# Patient Record
Sex: Female | Born: 1980
Health system: Southern US, Community
[De-identification: ages and names within clinical notes are randomized; demographics above are authoritative.]

## PROBLEM LIST (undated history)

## (undated) DIAGNOSIS — R51 Headache: Secondary | ICD-10-CM

## (undated) DIAGNOSIS — G709 Myoneural disorder, unspecified: Secondary | ICD-10-CM

## (undated) DIAGNOSIS — F419 Anxiety disorder, unspecified: Secondary | ICD-10-CM

## (undated) DIAGNOSIS — M542 Cervicalgia: Secondary | ICD-10-CM

## (undated) DIAGNOSIS — I471 Supraventricular tachycardia: Secondary | ICD-10-CM

## (undated) DIAGNOSIS — K219 Gastro-esophageal reflux disease without esophagitis: Secondary | ICD-10-CM

## (undated) DIAGNOSIS — D649 Anemia, unspecified: Secondary | ICD-10-CM

## (undated) DIAGNOSIS — K589 Irritable bowel syndrome without diarrhea: Secondary | ICD-10-CM

## (undated) DIAGNOSIS — M5430 Sciatica, unspecified side: Secondary | ICD-10-CM

## (undated) DIAGNOSIS — R519 Headache, unspecified: Secondary | ICD-10-CM

## (undated) HISTORY — DX: Supraventricular tachycardia: I47.1

---

## 2010-07-06 ENCOUNTER — Inpatient Hospital Stay: Payer: Self-pay | Admitting: Obstetrics and Gynecology

## 2012-09-08 ENCOUNTER — Ambulatory Visit: Payer: Self-pay | Admitting: Internal Medicine

## 2015-12-20 DIAGNOSIS — R102 Pelvic and perineal pain: Secondary | ICD-10-CM | POA: Diagnosis not present

## 2015-12-20 DIAGNOSIS — R141 Gas pain: Secondary | ICD-10-CM | POA: Diagnosis not present

## 2016-01-22 ENCOUNTER — Other Ambulatory Visit: Payer: Self-pay | Admitting: Student

## 2016-01-22 DIAGNOSIS — K219 Gastro-esophageal reflux disease without esophagitis: Secondary | ICD-10-CM | POA: Diagnosis not present

## 2016-01-22 DIAGNOSIS — R197 Diarrhea, unspecified: Secondary | ICD-10-CM | POA: Diagnosis not present

## 2016-01-22 DIAGNOSIS — R1084 Generalized abdominal pain: Secondary | ICD-10-CM | POA: Diagnosis not present

## 2016-01-26 ENCOUNTER — Ambulatory Visit
Admission: RE | Admit: 2016-01-26 | Discharge: 2016-01-26 | Disposition: A | Discharge status: Home / Self Care | Payer: 59 | Source: Ambulatory Visit | Attending: Student | Admitting: Student

## 2016-01-26 DIAGNOSIS — R1084 Generalized abdominal pain: Secondary | ICD-10-CM | POA: Insufficient documentation

## 2016-01-26 DIAGNOSIS — R197 Diarrhea, unspecified: Secondary | ICD-10-CM | POA: Insufficient documentation

## 2016-02-02 ENCOUNTER — Encounter: Payer: Self-pay | Admitting: *Deleted

## 2016-02-05 ENCOUNTER — Encounter: Payer: Self-pay | Admitting: *Deleted

## 2016-02-05 ENCOUNTER — Ambulatory Visit: Payer: 59 | Admitting: Anesthesiology

## 2016-02-05 ENCOUNTER — Ambulatory Visit
Admission: RE | Admit: 2016-02-05 | Discharge: 2016-02-05 | Disposition: A | Discharge status: Home / Self Care | Payer: 59 | Source: Ambulatory Visit | Attending: Gastroenterology | Admitting: Gastroenterology

## 2016-02-05 ENCOUNTER — Encounter
Admission: RE | Disposition: A | Discharge status: Home / Self Care | Payer: Self-pay | Source: Ambulatory Visit | Attending: Gastroenterology

## 2016-02-05 DIAGNOSIS — K529 Noninfective gastroenteritis and colitis, unspecified: Secondary | ICD-10-CM | POA: Diagnosis present

## 2016-02-05 DIAGNOSIS — D529 Folate deficiency anemia, unspecified: Secondary | ICD-10-CM | POA: Diagnosis not present

## 2016-02-05 DIAGNOSIS — R1084 Generalized abdominal pain: Secondary | ICD-10-CM | POA: Diagnosis not present

## 2016-02-05 DIAGNOSIS — Z79899 Other long term (current) drug therapy: Secondary | ICD-10-CM | POA: Diagnosis not present

## 2016-02-05 DIAGNOSIS — K228 Other specified diseases of esophagus: Secondary | ICD-10-CM | POA: Diagnosis not present

## 2016-02-05 DIAGNOSIS — K219 Gastro-esophageal reflux disease without esophagitis: Secondary | ICD-10-CM | POA: Insufficient documentation

## 2016-02-05 DIAGNOSIS — F419 Anxiety disorder, unspecified: Secondary | ICD-10-CM | POA: Insufficient documentation

## 2016-02-05 DIAGNOSIS — R197 Diarrhea, unspecified: Secondary | ICD-10-CM | POA: Diagnosis not present

## 2016-02-05 DIAGNOSIS — K3189 Other diseases of stomach and duodenum: Secondary | ICD-10-CM | POA: Diagnosis not present

## 2016-02-05 DIAGNOSIS — G709 Myoneural disorder, unspecified: Secondary | ICD-10-CM | POA: Diagnosis not present

## 2016-02-05 HISTORY — PX: COLONOSCOPY WITH PROPOFOL: SHX5780

## 2016-02-05 HISTORY — DX: Headache, unspecified: R51.9

## 2016-02-05 HISTORY — DX: Cervicalgia: M54.2

## 2016-02-05 HISTORY — DX: Myoneural disorder, unspecified: G70.9

## 2016-02-05 HISTORY — DX: Headache: R51

## 2016-02-05 HISTORY — DX: Gastro-esophageal reflux disease without esophagitis: K21.9

## 2016-02-05 HISTORY — PX: ESOPHAGOGASTRODUODENOSCOPY (EGD) WITH PROPOFOL: SHX5813

## 2016-02-05 HISTORY — DX: Sciatica, unspecified side: M54.30

## 2016-02-05 HISTORY — DX: Anemia, unspecified: D64.9

## 2016-02-05 HISTORY — DX: Anxiety disorder, unspecified: F41.9

## 2016-02-05 LAB — POCT PREGNANCY, URINE: PREG TEST UR: NEGATIVE

## 2016-02-05 SURGERY — COLONOSCOPY WITH PROPOFOL
Anesthesia: General

## 2016-02-05 MED ORDER — LACTATED RINGERS IV SOLN
INTRAVENOUS | Status: DC | PRN
  Administered 2016-02-05: 14:00:00 via INTRAVENOUS

## 2016-02-05 MED ORDER — SODIUM CHLORIDE 0.9 % IV SOLN
INTRAVENOUS | Status: DC
  Administered 2016-02-05: 14:00:00 via INTRAVENOUS

## 2016-02-05 MED ORDER — FENTANYL CITRATE (PF) 100 MCG/2ML IJ SOLN
INTRAMUSCULAR | Status: DC | PRN
  Administered 2016-02-05: 50 ug via INTRAVENOUS

## 2016-02-05 MED ORDER — PROPOFOL 500 MG/50ML IV EMUL
INTRAVENOUS | Status: DC | PRN
  Administered 2016-02-05: 75 ug/kg/min via INTRAVENOUS

## 2016-02-05 MED ORDER — MIDAZOLAM HCL 2 MG/2ML IJ SOLN
INTRAMUSCULAR | Status: DC | PRN
  Administered 2016-02-05: 1 mg via INTRAVENOUS

## 2016-02-05 NOTE — Anesthesia Preprocedure Evaluation (Signed)
Anesthesia Evaluation  Patient identified by MRN, date of birth, ID band Patient awake    Reviewed: Allergy & Precautions, H&P , NPO status , Patient's Chart, lab work & pertinent test results, reviewed documented beta blocker date and time   History of Anesthesia Complications Negative for: history of anesthetic complications  Airway Mallampati: I  TM Distance: >3 FB Neck ROM: full    Dental no notable dental hx. (+) Teeth Intact   Pulmonary neg pulmonary ROS,    Pulmonary exam normal breath sounds clear to auscultation       Cardiovascular Exercise Tolerance: Good negative cardio ROS Normal cardiovascular exam Rhythm:regular Rate:Normal     Neuro/Psych neg Seizures  Neuromuscular disease (Sciatica and cervicalgia) negative psych ROS   GI/Hepatic Neg liver ROS, GERD  Medicated,  Endo/Other  negative endocrine ROS  Renal/GU negative Renal ROS  negative genitourinary   Musculoskeletal   Abdominal   Peds  Hematology negative hematology ROS (+)   Anesthesia Other Findings Past Medical History:   Anemia                                                       Anxiety                                                      Neuromuscular disorder (HCC)                                 Cervicalgia                                                  GERD (gastroesophageal reflux disease)                       Headache                                                     Sciatica                                                     Reproductive/Obstetrics negative OB ROS                             Anesthesia Physical Anesthesia Plan  ASA: II  Anesthesia Plan: General   Post-op Pain Management:    Induction:   Airway Management Planned:   Additional Equipment:   Intra-op Plan:   Post-operative Plan:   Informed Consent: I have reviewed the patients History and Physical, chart, labs and  discussed the procedure including the risks, benefits and alternatives for the proposed anesthesia with  the patient or authorized representative who has indicated his/her understanding and acceptance.   Dental Advisory Given  Plan Discussed with: Anesthesiologist, CRNA and Surgeon  Anesthesia Plan Comments:         Anesthesia Quick Evaluation

## 2016-02-05 NOTE — Anesthesia Postprocedure Evaluation (Signed)
Anesthesia Post Note  Patient: Jill Andrews  Procedure(s) Performed: Procedure(s) (LRB): COLONOSCOPY WITH PROPOFOL (N/A) ESOPHAGOGASTRODUODENOSCOPY (EGD) WITH PROPOFOL (N/A)  Patient location during evaluation: Endoscopy Anesthesia Type: General Level of consciousness: awake and alert Pain management: pain level controlled Vital Signs Assessment: post-procedure vital signs reviewed and stable Respiratory status: spontaneous breathing, nonlabored ventilation, respiratory function stable and patient connected to nasal cannula oxygen Cardiovascular status: blood pressure returned to baseline and stable Postop Assessment: no signs of nausea or vomiting Anesthetic complications: no    Last Vitals:  Filed Vitals:   02/05/16 1500 02/05/16 1510  BP: 99/60 101/74  Pulse: 82 66  Temp:    Resp: 15 9    Last Pain: There were no vitals filed for this visit.               Lenard Simmer

## 2016-02-05 NOTE — Op Note (Signed)
[facility]enter[facility] Patient Name: Jill Andrews Procedure Date: 02/05/2016 2:06 PM MRN: [id number] Account #: [id number] Date of Birth: February 09, 1981 Admit Type: Outpatient Age: 35 Room: [facility] ENDO ROOM 2 Gender: Female Note Status: Finalized Procedure:            Upper GI endoscopy Indications:          Generalized abdominal pain, Heartburn, Suspected                        esophageal reflux Patient Profile:      This is a 35 year old female. Providers:            Rhona Raider. Shelle Iron, MD Referring MD:         Duane Lope. Judithann Sheen, MD (Referring MD) Medicines:            Propofol per Anesthesia Complications:        No immediate complications. Estimated blood loss:                        Minimal. Procedure:            Pre-Anesthesia Assessment:                       - Prior to the procedure, a History and Physical was                        performed, and patient medications, allergies and                        sensitivities were reviewed. The patient's tolerance of                        previous anesthesia was reviewed.                       After obtaining informed consent, the endoscope was                        passed under direct vision. Throughout the procedure,                        the patient's blood pressure, pulse, and oxygen                        saturations were monitored continuously. The Endoscope                        was introduced through the mouth, and advanced to the                        duodenal bulb. The upper GI endoscopy was accomplished                        without difficulty. The patient tolerated the procedure                        well. Findings:      The Z-line was irregular and was found at the gastroesophageal junction.       Biopsies were taken with a cold forceps for histology.      The stomach was normal.  The examined duodenum was normal. Impression:           - Z-line irregular, at the gastroesophageal junction.                         Biopsied.                       - Normal stomach.                       - Normal examined duodenum. Recommendation:       - Perform a colonoscopy today.                       - Continue present medications.                       - Await pathology results.                       - The findings and recommendations were discussed with                        the patient.                       - The findings and recommendations were discussed with                        the patient's family. Procedure Code(s):    --- Professional ---                       [phone], Esophagogastroduodenoscopy, flexible, transoral;                        with biopsy, single or multiple Diagnosis Code(s):    --- Professional ---                       K22.8, Other specified diseases of esophagus CPT copyright 2016 American Medical Association. All rights reserved. The codes documented in this report are preliminary and upon coder review may  be revised to meet current compliance requirements. Kathalene Frames, MD 02/05/2016 2:20:53 PM This report has been signed electronically. Number of Addenda: 0 Note Initiated On: 02/05/2016 2:06 PM      [facility]

## 2016-02-05 NOTE — Op Note (Addendum)
[facility]astroenterology Patient Name: Jill Andrews Procedure Date: 02/05/2016 2:04 PM MRN: [id number] Account #: [id number] Date of Birth: 13-Jul-1981 Admit Type: Outpatient Age: 35 Room: [facility] ENDO ROOM 2 Gender: Female Note Status: Finalized Procedure:            Colonoscopy Indications:          Chronic diarrhea Patient Profile:      This is a 35 year old female. Providers:            Rhona Raider. Shelle Iron, MD Referring MD:         Duane Lope. Judithann Sheen, MD (Referring MD) Medicines:            Propofol per Anesthesia Complications:        No immediate complications. Procedure:            Pre-Anesthesia Assessment:                       - Prior to the procedure, a History and Physical was                        performed, and patient medications, allergies and                        sensitivities were reviewed. The patient's tolerance of                        previous anesthesia was reviewed.                       After obtaining informed consent, the colonoscope was                        passed under direct vision. Throughout the procedure,                        the patient's blood pressure, pulse, and oxygen                        saturations were monitored continuously. The Olympus                        CF-Q160AL colonoscope (S#. [id number]) was introduced                        through the anus and advanced to the the terminal                        ileum. The colonoscopy was performed without                        difficulty. The patient tolerated the procedure well.                        The quality of the bowel preparation was good. Findings:      The perianal and digital rectal examinations were normal.      The terminal ileum appeared normal.      The entire examined colon appeared normal on direct and retroflexion       views.      Biopsies for histology were taken with a cold forceps from the right  colon, left colon and rectum for evaluation of  microscopic colitis. Impression:           - The examined portion of the ileum was normal.                       - The entire examined colon is normal on direct and                        retroflexion views.                       - Biopsies were taken with a cold forceps from the                        right colon, left colon and rectum for evaluation of                        microscopic colitis.                       - This is likely IBS-D Recommendation:       - Observe patient in GI recovery unit.                       - High fiber diet.                       - Continue present medications.                       - Await pathology results.                       - Return to GI clinic.                       - Try probiotic, low fodmap diet, as needed imodium                       - Consider xifaxin for IBS-D if above not helpful                       - The findings and recommendations were discussed with                        the patient.                       - The findings and recommendations were discussed with                        the patient's family. Procedure Code(s):    --- Professional ---                       [phone], Colonoscopy, flexible; with biopsy, single or                        multiple Diagnosis Code(s):    --- Professional ---                       K52.9, Noninfective gastroenteritis and colitis,  unspecified CPT copyright 2016 American Medical Association. All rights reserved. The codes documented in this report are preliminary and upon coder review may  be revised to meet current compliance requirements. Kathalene Frames, MD 02/05/2016 2:42:55 PM This report has been signed electronically. Number of Addenda: 0 Note Initiated On: 02/05/2016 2:04 PM Scope Withdrawal Time: 0 hours 11 minutes 30 seconds  Total Procedure Duration: 0 hours 15 minutes 57 seconds       [facility]

## 2016-02-05 NOTE — Transfer of Care (Signed)
Immediate Anesthesia Transfer of Care Note  Patient: Jill Andrews  Procedure(s) Performed: Procedure(s): COLONOSCOPY WITH PROPOFOL (N/A) ESOPHAGOGASTRODUODENOSCOPY (EGD) WITH PROPOFOL (N/A)  Patient Location: PACU  Anesthesia Type:General  Level of Consciousness: awake and alert   Airway & Oxygen Therapy: Patient Spontanous Breathing  Post-op Assessment: Report given to RN  Post vital signs: Reviewed and stable  Last Vitals:  Filed Vitals:   02/05/16 1329 02/05/16 1441  BP: 132/64 137/64  Pulse: 88 90  Temp: 36.3 C 36.4 C  Resp: 14 16    Complications: No apparent anesthesia complications

## 2016-02-05 NOTE — H&P (Signed)
  Primary Care Physician:  Marguarite Arbour, MD  Pre-Procedure History & Physical: HPI:  Jill Andrews is a 35 y.o. female is here for an endoscopyj / colonoscopy   Past Medical History  Diagnosis Date  . Anemia   . Anxiety   . Neuromuscular disorder (HCC)   . Cervicalgia   . GERD (gastroesophageal reflux disease)   . Headache   . Sciatica     History reviewed. No pertinent past surgical history.  Prior to Admission medications   Medication Sig Start Date End Date Taking? Authorizing Provider  metoprolol succinate (TOPROL-XL) 25 MG 24 hr tablet Take 25 mg by mouth daily.   Yes Historical Provider, MD  Multiple Vitamin (MULTI VITAMIN PO) Take 1 tablet by mouth daily.   Yes Historical Provider, MD  omeprazole (PRILOSEC) 40 MG capsule Take 40 mg by mouth daily.   Yes Historical Provider, MD  simethicone (MYLICON) 80 MG chewable tablet Chew 80 mg by mouth 4 (four) times daily as needed for flatulence.   Yes Historical Provider, MD  Zinc Acetate, Oral, (ZINC ACETATE PO) Take 1 capsule by mouth daily.   Yes Historical Provider, MD    Allergies as of 01/31/2016  . (Not on File)    History reviewed. No pertinent family history.  Social History   Social History  . Marital Status: Married    Spouse Name: N/A  . Number of Children: N/A  . Years of Education: N/A   Occupational History  . Not on file.   Social History Main Topics  . Smoking status: Never Smoker   . Smokeless tobacco: Never Used  . Alcohol Use: No  . Drug Use: No  . Sexual Activity: Not on file   Other Topics Concern  . Not on file   Social History Narrative     Physical Exam: BP 132/64 mmHg  Pulse 88  Temp(Src) 97.3 F (36.3 C) (Tympanic)  Resp 14  Ht 5' (1.524 m)  Wt 52.617 kg (116 lb)  BMI 22.65 kg/m2  SpO2 100%  LMP 01/22/2016 General:   Alert,  pleasant and cooperative in NAD Head:  Normocephalic and atraumatic. Neck:  Supple; no masses or thyromegaly. Lungs:  Clear throughout to  auscultation.    Heart:  Regular rate and rhythm. Abdomen:  Soft, nontender and nondistended. Normal bowel sounds, without guarding, and without rebound.   Neurologic:  Alert and  oriented x4;  grossly normal neurologically.  Impression/Plan: Jill Andrews is here for an endoscopy to be performed for GERD, abd pain, colon for diarrhea  Risks, benefits, limitations, and alternatives regarding  Endoscopy/colonoscopy have been reviewed with the patient.  Questions have been answered.  All parties agreeable.   Jill Maxwell, MD  02/05/2016, 2:05 PM

## 2016-02-05 NOTE — Discharge Instructions (Signed)

## 2016-02-06 ENCOUNTER — Encounter: Payer: Self-pay | Admitting: Gastroenterology

## 2016-02-08 LAB — SURGICAL PATHOLOGY

## 2016-02-22 DIAGNOSIS — Z3200 Encounter for pregnancy test, result unknown: Secondary | ICD-10-CM | POA: Diagnosis not present

## 2016-02-26 DIAGNOSIS — N926 Irregular menstruation, unspecified: Secondary | ICD-10-CM | POA: Diagnosis not present

## 2016-03-12 DIAGNOSIS — J069 Acute upper respiratory infection, unspecified: Secondary | ICD-10-CM | POA: Diagnosis not present

## 2016-03-31 ENCOUNTER — Ambulatory Visit
Admission: EM | Admit: 2016-03-31 | Discharge: 2016-03-31 | Disposition: A | Discharge status: Home / Self Care | Payer: 59 | Attending: Family Medicine | Admitting: Family Medicine

## 2016-03-31 ENCOUNTER — Encounter: Payer: Self-pay | Admitting: *Deleted

## 2016-03-31 ENCOUNTER — Emergency Department
Admission: EM | Admit: 2016-03-31 | Discharge: 2016-03-31 | Disposition: A | Discharge status: Home / Self Care | Payer: 59 | Attending: Family Medicine | Admitting: Family Medicine

## 2016-03-31 DIAGNOSIS — J020 Streptococcal pharyngitis: Secondary | ICD-10-CM

## 2016-03-31 DIAGNOSIS — J029 Acute pharyngitis, unspecified: Secondary | ICD-10-CM | POA: Insufficient documentation

## 2016-03-31 DIAGNOSIS — Z79899 Other long term (current) drug therapy: Secondary | ICD-10-CM | POA: Insufficient documentation

## 2016-03-31 DIAGNOSIS — D649 Anemia, unspecified: Secondary | ICD-10-CM | POA: Insufficient documentation

## 2016-03-31 DIAGNOSIS — Z5321 Procedure and treatment not carried out due to patient leaving prior to being seen by health care provider: Secondary | ICD-10-CM | POA: Insufficient documentation

## 2016-03-31 HISTORY — DX: Irritable bowel syndrome without diarrhea: K58.9

## 2016-03-31 LAB — RAPID STREP SCREEN (MED CTR MEBANE ONLY): STREPTOCOCCUS, GROUP A SCREEN (DIRECT): POSITIVE — AB

## 2016-03-31 MED ORDER — AZITHROMYCIN 250 MG PO TABS: ORAL_TABLET | ORAL | Status: DC

## 2016-03-31 NOTE — ED Notes (Signed)
Pt reports started on Friday with sore throat feeling on her left side and only when she swallowed. Pain worse and pt states she feels like she can not swallow well. Pt clearing throat frequently in triage but is handling her secretions with no difficulty.

## 2016-03-31 NOTE — ED Notes (Signed)
Pt states that she has a sore throat, started on Friday, sore on the left side shooting pain into left ear.  Pt states that she feels like it is "swelling shut", was [facility] at [facility] ED at 1:30 am, was told that her throat was not swollen shut, so she left.

## 2016-03-31 NOTE — Discharge Instructions (Signed)

## 2016-03-31 NOTE — ED Provider Notes (Signed)
CSN: [id number]     Arrival date & time 03/31/16  0807 History   None    Chief Complaint  Patient presents with  . Sore Throat   (Consider location/radiation/quality/duration/timing/severity/associated sxs/prior Treatment) HPI   35 year old female who started having a sore throat 2 days ago which is more on the left side with shooting pain into her left ear. He states that her throat feels like it is "swelling shut" she became alarmed last night and went to [facility] emergency department at 1:30 AM was told that her throat was not swollen shut and she had 22 other people waiting in front of her so she left and decided to come here this morning. He has been afebrile. 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 In review of the patient's records she was seen 1 month ago with a URI. She does not have significant cough.  Past Medical History  Diagnosis Date  . Anemia   . Anxiety   . Neuromuscular disorder (HCC)   . Cervicalgia   . GERD (gastroesophageal reflux disease)   . Headache   . Sciatica   . Irritable bowel syndrome (IBS)    Past Surgical History  Procedure Laterality Date  . Colonoscopy with propofol N/A 02/05/2016    Procedure: COLONOSCOPY WITH PROPOFOL;  Surgeon: Elnita MaxwellMatthew Gordon Rein, MD;  [facility];  Service: Endoscopy;  Laterality: N/A;  . Esophagogastroduodenoscopy (egd) with propofol N/A 02/05/2016    Procedure: ESOPHAGOGASTRODUODENOSCOPY (EGD) WITH PROPOFOL;  Surgeon: Elnita MaxwellMatthew Gordon Rein, MD;  Location[facility] ENDOSCOPY;  Service: Endoscopy;  Laterality: N/A;   No family history on file. Social History  Substance Use Topics  . Smoking status: Never Smoker   . Smokeless tobacco: Never Used  . Alcohol Use: No   OB History    No data available     Review of Systems  Constitutional: Negative for fever, chills, activity change and fatigue.  HENT: Positive for sore throat.   Respiratory: Negative for cough, shortness  of breath, wheezing and stridor.   All other systems reviewed and are negative.   Allergies  Penicillins and Sulfa antibiotics  Home Medications   Prior to Admission medications   Medication Sig Start Date End Date Taking? Authorizing Provider  azithromycin (ZITHROMAX Z-PAK) 250 MG tablet Use as per package instructions 03/31/16   Lutricia FeilWilliam P Kerina Simoneau, PA-C  metoprolol succinate (TOPROL-XL) 25 MG 24 hr tablet Take 25 mg by mouth daily.    Historical Provider, MD  Multiple Vitamin (MULTI VITAMIN PO) Take 1 tablet by mouth daily.    Historical Provider, MD  omeprazole (PRILOSEC) 40 MG capsule Take 40 mg by mouth daily.    Historical Provider, MD  simethicone (MYLICON) 80 MG chewable tablet Chew 80 mg by mouth 4 (four) times daily as needed for flatulence.    Historical Provider, MD  Zinc Acetate, Oral, (ZINC ACETATE PO) Take 1 capsule by mouth daily.    Historical Provider, MD   Meds Ordered and Administered this Visit  Medications - No data to display  BP 115/69 mmHg  Pulse 96  Temp(Src) 98.2 F (36.8 C) (Oral)  Ht 5' (1.524 m)  Wt 118 lb (53.524 kg)  BMI 23.05 kg/m2  SpO2 100%  LMP 03/28/2016 No data found.   Physical Exam  Constitutional: She is oriented to person, place, and time. She appears well-developed and well-nourished. No distress.  HENT:  Head: Normocephalic and atraumatic.  Right Ear: External  ear normal.  Left Ear: External ear normal.  Nose: Nose normal.  Mouth/Throat: Oropharynx is clear and moist. No oropharyngeal exudate.  Eyes: Conjunctivae are normal. Pupils are equal, round, and reactive to light. Right eye exhibits no discharge. Left eye exhibits no discharge.  Neck: Normal range of motion. Neck supple.  Pulmonary/Chest: Effort normal and breath sounds normal. No respiratory distress. She has no wheezes.  Musculoskeletal: Normal range of motion. She exhibits no edema or tenderness.  Lymphadenopathy:    She has no cervical adenopathy.  Neurological: She  is alert and oriented to person, place, and time.  Skin: Skin is warm and dry. She is not diaphoretic.  Psychiatric: She has a normal mood and affect. Her behavior is normal. Judgment and thought content normal.  Nursing note and vitals reviewed.   ED Course  Procedures (including critical care time)  Labs Review Labs Reviewed  RAPID STREP SCREEN (NOT AT [facility]) - Abnormal; Notable for the following:    Streptococcus, Group A Screen (Direct) POSITIVE (*)    All other components within normal limits    Imaging Review No results found.   Visual Acuity Review  Right Eye Distance:   Left Eye Distance:   Bilateral Distance:    Right Eye Near:   Left Eye Near:    Bilateral Near:         MDM   1. Strep pharyngitis    Discharge Medication List as of 03/31/2016  9:19 AM    START taking these medications   Details  azithromycin (ZITHROMAX Z-PAK) 250 MG tablet Use as per package instructions, Normal      Plan: 1. Test/x-ray results and diagnosis reviewed with patient 2. rx as per orders; risks, benefits, potential side effects reviewed with patient 3. Recommend supportive treatment with  Motrin as necessary for sore throat. And also gargle with salt water ad lib. Recommended the use of lozenges or cough drops to help soothe her throat during the daytime. At nighttime she should consider using a cool mist vaporizer in her house. We'll keep her out of work tomorrow until she's had 24 hours of antibiotic use and then may return to work on Tuesday. If she is not improving she should follow-up with her primary care physician. 4. F/u prn if symptoms worsen or don't improve     Lutricia Feil, PA-C 03/31/16 [phone]

## 2016-04-01 DIAGNOSIS — J020 Streptococcal pharyngitis: Secondary | ICD-10-CM | POA: Diagnosis not present

## 2016-07-01 DIAGNOSIS — R000 Tachycardia, unspecified: Secondary | ICD-10-CM | POA: Diagnosis not present

## 2016-07-01 DIAGNOSIS — Z79899 Other long term (current) drug therapy: Secondary | ICD-10-CM | POA: Diagnosis not present

## 2016-07-01 DIAGNOSIS — R002 Palpitations: Secondary | ICD-10-CM | POA: Diagnosis not present

## 2016-07-03 DIAGNOSIS — I471 Supraventricular tachycardia: Secondary | ICD-10-CM | POA: Diagnosis not present

## 2016-07-03 DIAGNOSIS — R002 Palpitations: Secondary | ICD-10-CM | POA: Diagnosis not present

## 2016-07-08 DIAGNOSIS — N926 Irregular menstruation, unspecified: Secondary | ICD-10-CM | POA: Diagnosis not present

## 2016-07-09 DIAGNOSIS — R002 Palpitations: Secondary | ICD-10-CM | POA: Diagnosis not present

## 2016-07-12 DIAGNOSIS — I471 Supraventricular tachycardia: Secondary | ICD-10-CM | POA: Diagnosis not present

## 2016-07-12 DIAGNOSIS — R002 Palpitations: Secondary | ICD-10-CM | POA: Diagnosis not present

## 2016-07-16 DIAGNOSIS — I471 Supraventricular tachycardia: Secondary | ICD-10-CM | POA: Diagnosis not present

## 2016-07-16 DIAGNOSIS — R002 Palpitations: Secondary | ICD-10-CM | POA: Diagnosis not present

## 2016-07-29 DIAGNOSIS — O200 Threatened abortion: Secondary | ICD-10-CM | POA: Diagnosis not present

## 2016-08-05 DIAGNOSIS — Z3480 Encounter for supervision of other normal pregnancy, unspecified trimester: Secondary | ICD-10-CM | POA: Diagnosis not present

## 2016-08-05 LAB — OB RESULTS CONSOLE VARICELLA ZOSTER ANTIBODY, IGG: Varicella: IMMUNE

## 2016-08-05 LAB — OB RESULTS CONSOLE HEPATITIS B SURFACE ANTIGEN: Hepatitis B Surface Ag: NEGATIVE

## 2016-08-05 LAB — OB RESULTS CONSOLE ABO/RH: RH TYPE: POSITIVE

## 2016-08-05 LAB — OB RESULTS CONSOLE HIV ANTIBODY (ROUTINE TESTING): HIV: NONREACTIVE

## 2016-08-05 LAB — OB RESULTS CONSOLE RUBELLA ANTIBODY, IGM: Rubella: IMMUNE

## 2016-08-08 ENCOUNTER — Other Ambulatory Visit: Payer: Self-pay | Admitting: Obstetrics and Gynecology

## 2016-08-08 DIAGNOSIS — Z369 Encounter for antenatal screening, unspecified: Secondary | ICD-10-CM

## 2016-08-09 IMAGING — US US ABDOMEN COMPLETE
1 series · 14 of 25 positions shown · non-contrast
Comparison: None.

CLINICAL DATA: Generalized abdominal pain for 6 weeks.  Diarrhea.

EXAM:
ABDOMEN ULTRASOUND COMPLETE

[Series 1: us abdomen complete · 0.17mm/px · 14 of 96 slices shown]
[im 1/96]
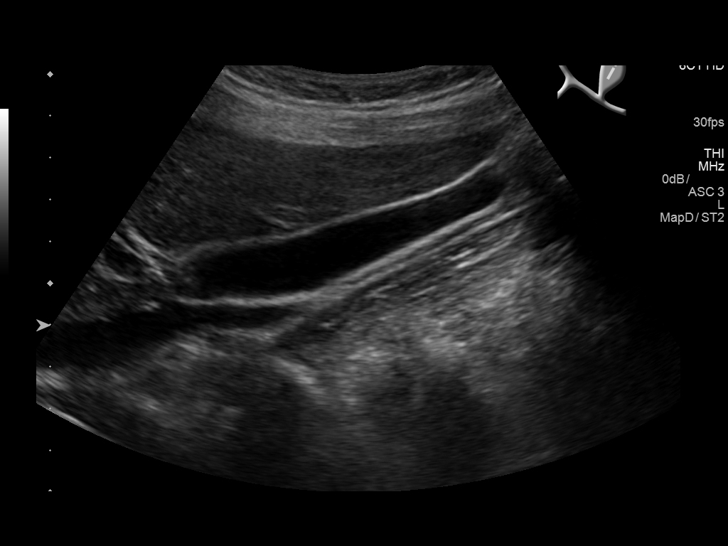
[im 8/96]
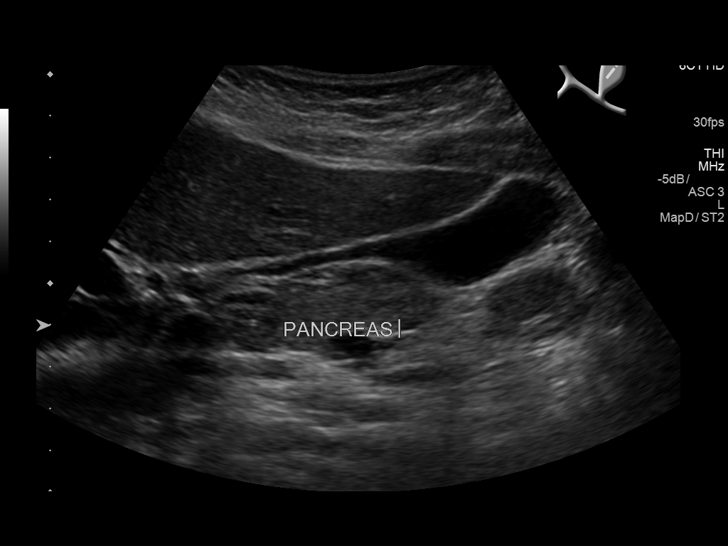
[im 16/96]
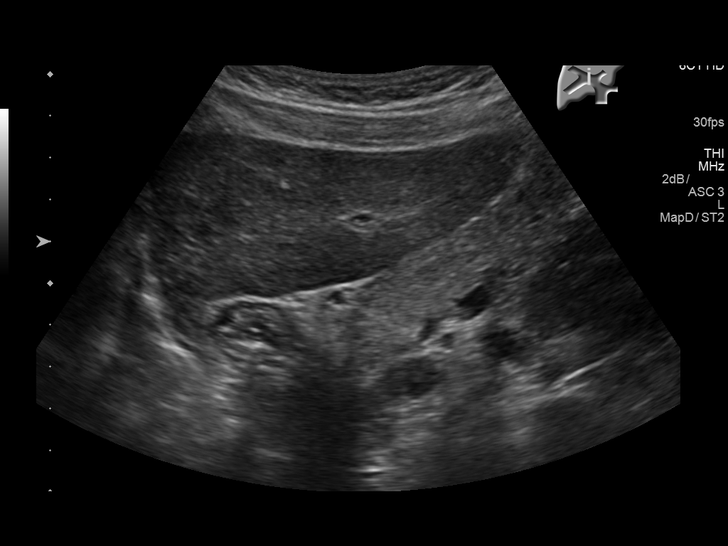
[im 24/96]
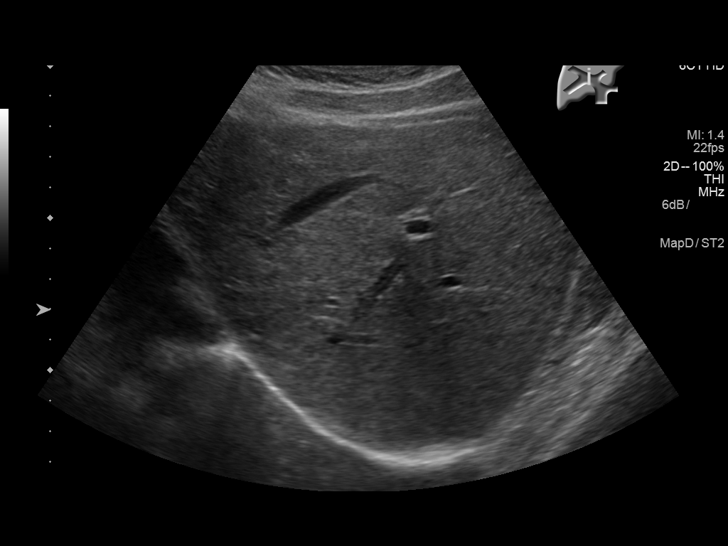
[im 32/96]
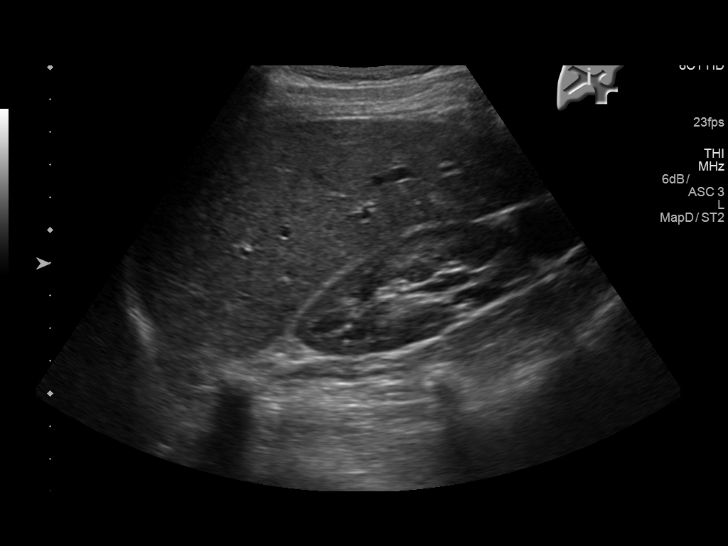
[im 36/96]
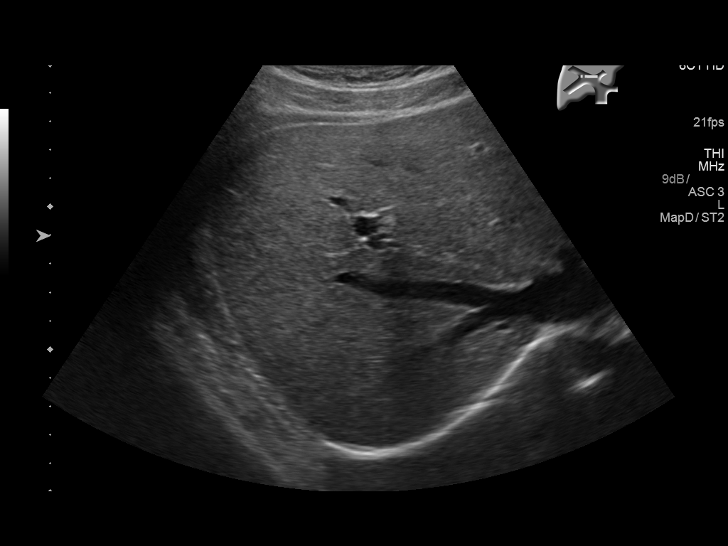
[im 44/96]
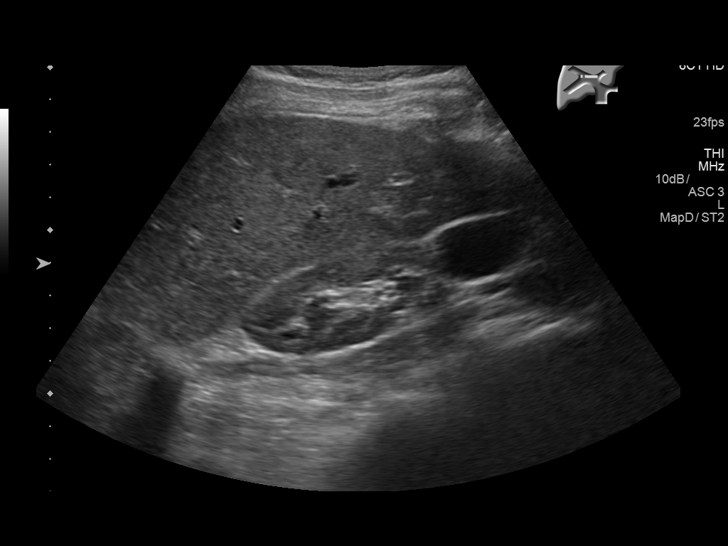
[im 52/96]
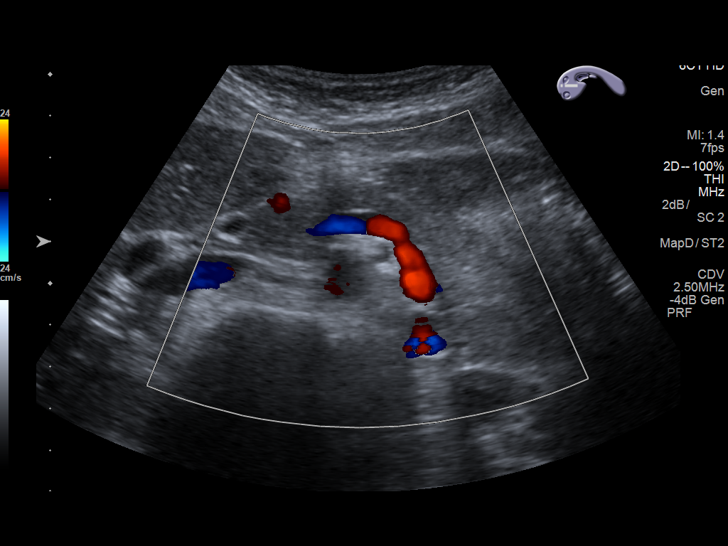
[im 60/96]
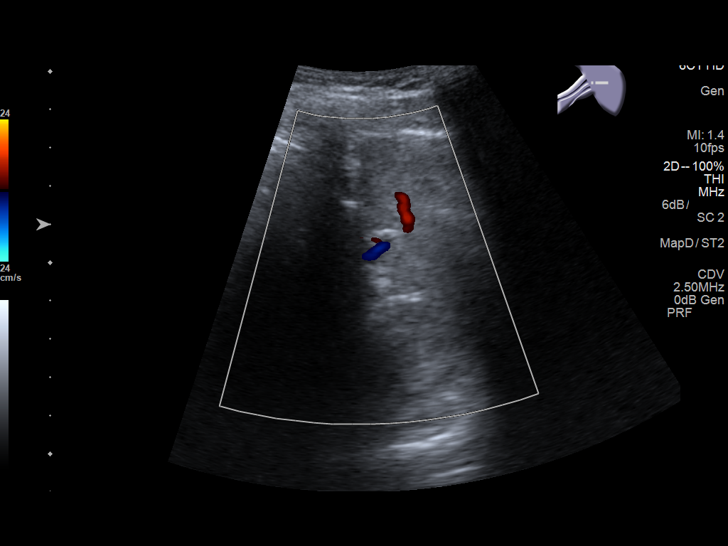
[im 64/96]
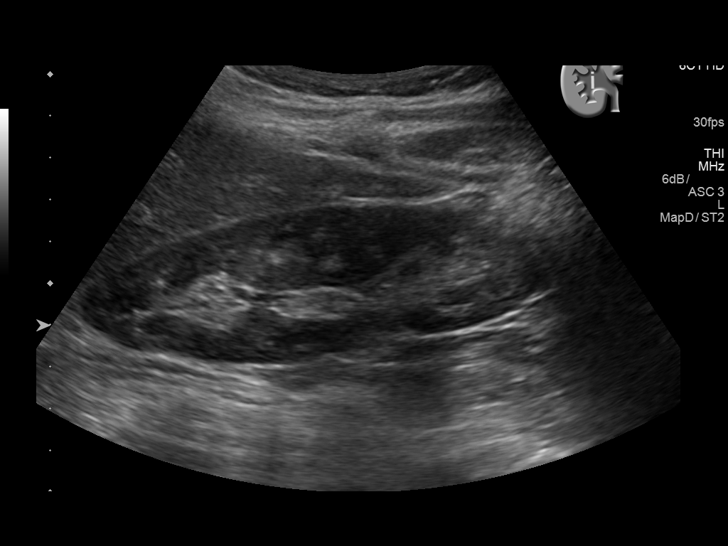
[im 72/96]
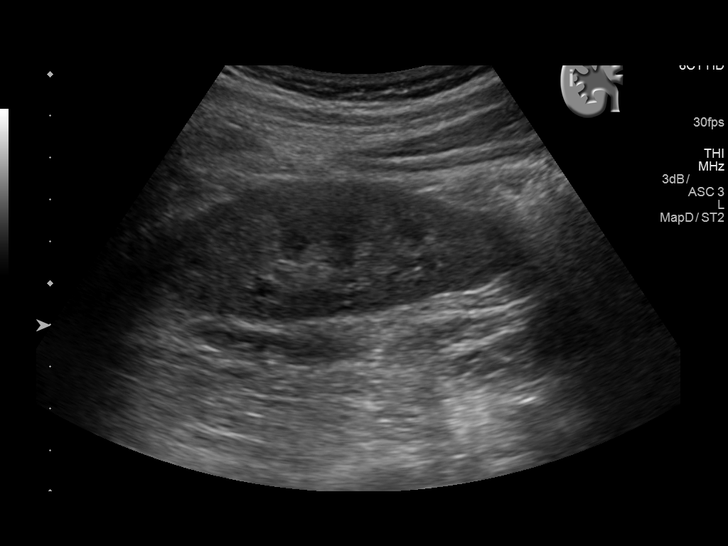
[im 80/96]
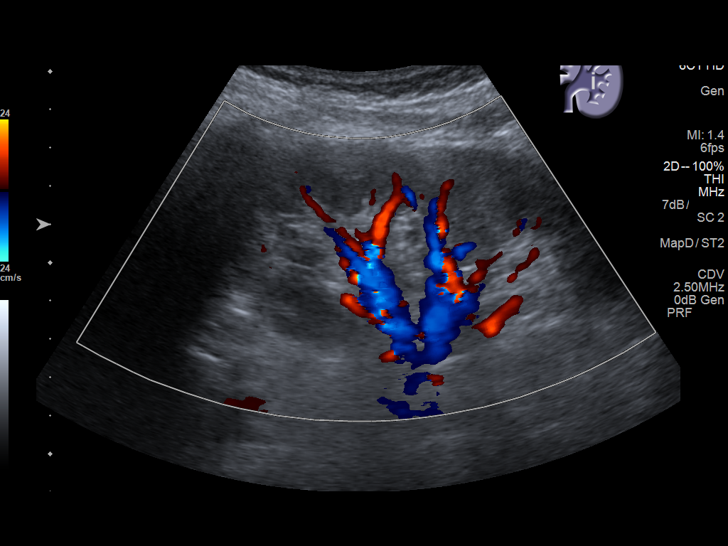
[im 88/96]
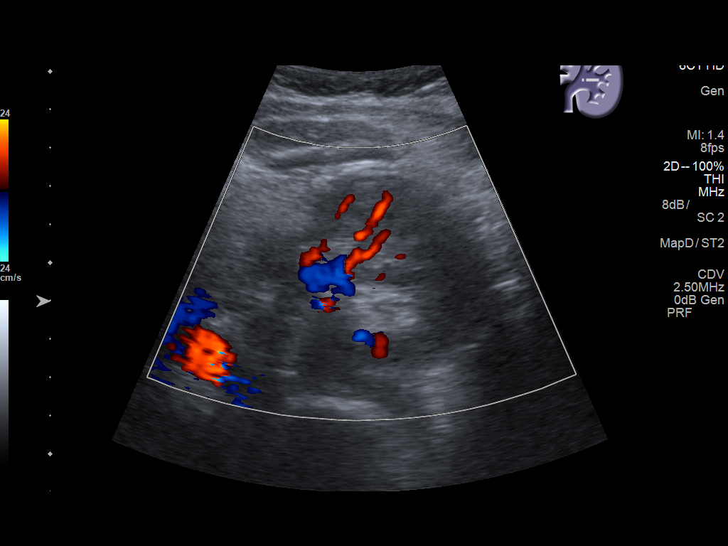
[im 96/96]
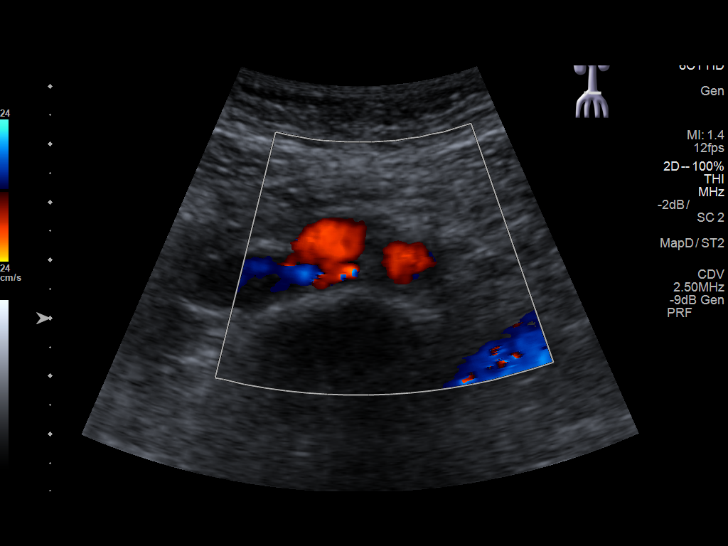

[14 of 25 positions shown; findings below may reference images not displayed]

FINDINGS: Gallbladder: No gallstones or wall thickening visualized. No
sonographic Murphy sign noted by sonographer.

Common bile duct: Diameter: 5 mm, within normal limits.

Liver: No focal lesion identified. Within normal limits in
parenchymal echogenicity.

IVC: No abnormality visualized.

Pancreas: Visualized portion unremarkable.

Spleen: Size and appearance within normal limits.

Right Kidney: Length: 11.3 cm. Echogenicity within normal limits. No
mass or hydronephrosis visualized.

Left Kidney: Length: 11.4 cm. Echogenicity within normal limits. No
mass or hydronephrosis visualized.

Abdominal aorta: No aneurysm visualized.

Other findings: None.
IMPRESSION: Negative abdominal ultrasound.

## 2016-08-29 ENCOUNTER — Other Ambulatory Visit: Payer: 59

## 2016-08-29 ENCOUNTER — Ambulatory Visit: Payer: 59

## 2016-09-06 DIAGNOSIS — Z23 Encounter for immunization: Secondary | ICD-10-CM | POA: Diagnosis not present

## 2016-12-12 DIAGNOSIS — Z3480 Encounter for supervision of other normal pregnancy, unspecified trimester: Secondary | ICD-10-CM | POA: Diagnosis not present

## 2016-12-13 DIAGNOSIS — O4442 Low lying placenta NOS or without hemorrhage, second trimester: Secondary | ICD-10-CM | POA: Diagnosis not present

## 2016-12-13 DIAGNOSIS — Z23 Encounter for immunization: Secondary | ICD-10-CM | POA: Diagnosis not present

## 2016-12-16 NOTE — L&D Delivery Note (Signed)
Delivery Note At 6:40 AM on 03/04/17 a viable female was delivered via Vaginal, Spontaneous Delivery (Presentation: ROA ).  APGAR: ,8/9 weight  .  Delayed cord clamping . Placenta status: intact .  Cord:nuchal reduced   with the following complications:None .  Cord pH: not done   Anesthesia: CLE   Episiotomy: None Lacerations: 2nd degree Suture Repair: 2.0 3.0 vicryl Est. Blood Loss (mL):  250 cc  Mom to postpartum.  Baby to Couplet care / Skin to Skin.  SCHERMERHORN,THOMAS 03/04/2017, 7:01 AM

## 2016-12-17 DIAGNOSIS — Z331 Pregnant state, incidental: Secondary | ICD-10-CM | POA: Diagnosis not present

## 2016-12-17 DIAGNOSIS — L03114 Cellulitis of left upper limb: Secondary | ICD-10-CM | POA: Diagnosis not present

## 2016-12-20 DIAGNOSIS — R7302 Impaired glucose tolerance (oral): Secondary | ICD-10-CM | POA: Diagnosis not present

## 2017-02-07 DIAGNOSIS — Z3480 Encounter for supervision of other normal pregnancy, unspecified trimester: Secondary | ICD-10-CM | POA: Diagnosis not present

## 2017-02-07 LAB — OB RESULTS CONSOLE GBS: STREP GROUP B AG: NEGATIVE

## 2017-02-07 LAB — OB RESULTS CONSOLE GC/CHLAMYDIA
Chlamydia: NEGATIVE
Gonorrhea: NEGATIVE

## 2017-02-07 LAB — OB RESULTS CONSOLE RPR: RPR: NONREACTIVE

## 2017-03-03 ENCOUNTER — Encounter: Payer: Self-pay | Admitting: *Deleted

## 2017-03-03 ENCOUNTER — Inpatient Hospital Stay
Admission: EM | Admit: 2017-03-03 | Discharge: 2017-03-05 | DRG: 775 | Disposition: A | Discharge status: Home / Self Care | Payer: 59 | Attending: Obstetrics and Gynecology | Admitting: Obstetrics and Gynecology

## 2017-03-03 DIAGNOSIS — K219 Gastro-esophageal reflux disease without esophagitis: Secondary | ICD-10-CM | POA: Diagnosis present

## 2017-03-03 DIAGNOSIS — D649 Anemia, unspecified: Secondary | ICD-10-CM | POA: Diagnosis present

## 2017-03-03 DIAGNOSIS — Z3490 Encounter for supervision of normal pregnancy, unspecified, unspecified trimester: Secondary | ICD-10-CM

## 2017-03-03 DIAGNOSIS — O9902 Anemia complicating childbirth: Secondary | ICD-10-CM | POA: Diagnosis present

## 2017-03-03 DIAGNOSIS — O9962 Diseases of the digestive system complicating childbirth: Secondary | ICD-10-CM | POA: Diagnosis present

## 2017-03-03 DIAGNOSIS — Z3A39 39 weeks gestation of pregnancy: Secondary | ICD-10-CM | POA: Diagnosis not present

## 2017-03-03 DIAGNOSIS — Z3493 Encounter for supervision of normal pregnancy, unspecified, third trimester: Secondary | ICD-10-CM | POA: Diagnosis present

## 2017-03-03 LAB — CBC
HCT: 32.6 % — ABNORMAL LOW (ref 35.0–47.0)
Hemoglobin: 11.2 g/dL — ABNORMAL LOW (ref 12.0–16.0)
MCH: 32.1 pg (ref 26.0–34.0)
MCHC: 34.4 g/dL (ref 32.0–36.0)
MCV: 93.4 fL (ref 80.0–100.0)
PLATELETS: 210 10*3/uL (ref 150–440)
RBC: 3.49 MIL/uL — ABNORMAL LOW (ref 3.80–5.20)
RDW: 14.9 % — AB (ref 11.5–14.5)
WBC: 11.3 10*3/uL — ABNORMAL HIGH (ref 3.6–11.0)

## 2017-03-03 MED ORDER — OXYTOCIN 10 UNIT/ML IJ SOLN
INTRAMUSCULAR | Status: AC
  Filled 2017-03-03: qty 2

## 2017-03-03 MED ORDER — LACTATED RINGERS IV SOLN
500.0000 mL | INTRAVENOUS | Status: DC | PRN
  Administered 2017-03-04: 500 mL via INTRAVENOUS

## 2017-03-03 MED ORDER — LIDOCAINE HCL (PF) 1 % IJ SOLN
INTRAMUSCULAR | Status: AC
  Filled 2017-03-03: qty 30

## 2017-03-03 MED ORDER — OXYCODONE-ACETAMINOPHEN 5-325 MG PO TABS
1.0000 | ORAL_TABLET | ORAL | Status: DC | PRN
  Administered 2017-03-04: 1 via ORAL
  Filled 2017-03-03: qty 1

## 2017-03-03 MED ORDER — LIDOCAINE HCL (PF) 1 % IJ SOLN: 30.0000 mL | INTRAMUSCULAR | Status: DC | PRN

## 2017-03-03 MED ORDER — BUTORPHANOL TARTRATE 1 MG/ML IJ SOLN
1.0000 mg | INTRAMUSCULAR | Status: DC | PRN
  Administered 2017-03-04: 1 mg via INTRAVENOUS
  Filled 2017-03-03: qty 2

## 2017-03-03 MED ORDER — MISOPROSTOL 200 MCG PO TABS
ORAL_TABLET | ORAL | Status: AC
  Filled 2017-03-03: qty 4

## 2017-03-03 MED ORDER — OXYTOCIN 40 UNITS IN LACTATED RINGERS INFUSION - SIMPLE MED
INTRAVENOUS | Status: AC
  Filled 2017-03-03: qty 1000

## 2017-03-03 MED ORDER — OXYCODONE-ACETAMINOPHEN 5-325 MG PO TABS: 2.0000 | ORAL_TABLET | ORAL | Status: DC | PRN

## 2017-03-03 MED ORDER — AMMONIA AROMATIC IN INHA
RESPIRATORY_TRACT | Status: AC
  Filled 2017-03-03: qty 10

## 2017-03-03 MED ORDER — LACTATED RINGERS IV SOLN
INTRAVENOUS | Status: DC
  Administered 2017-03-03: 125 mL/h via INTRAVENOUS

## 2017-03-03 MED ORDER — ONDANSETRON HCL 4 MG/2ML IJ SOLN: 4.0000 mg | Freq: Four times a day (QID) | INTRAMUSCULAR | Status: DC | PRN

## 2017-03-03 MED ORDER — SOD CITRATE-CITRIC ACID 500-334 MG/5ML PO SOLN
30.0000 mL | ORAL | Status: DC | PRN
  Filled 2017-03-03: qty 30

## 2017-03-03 MED ORDER — OXYTOCIN 40 UNITS IN LACTATED RINGERS INFUSION - SIMPLE MED: 2.5000 [IU]/h | INTRAVENOUS | Status: DC

## 2017-03-03 MED ORDER — ACETAMINOPHEN 325 MG PO TABS: 650.0000 mg | ORAL_TABLET | ORAL | Status: DC | PRN

## 2017-03-03 MED ORDER — DINOPROSTONE 10 MG VA INST
10.0000 mg | VAGINAL_INSERT | Freq: Once | VAGINAL | Status: AC
Start: 2017-03-03 — End: 2017-03-03
  Administered 2017-03-03: 10 mg via VAGINAL
  Filled 2017-03-03: qty 1

## 2017-03-03 MED ORDER — OXYTOCIN BOLUS FROM INFUSION
500.0000 mL | Freq: Once | INTRAVENOUS | Status: AC
  Administered 2017-03-04: 40 [IU] via INTRAVENOUS

## 2017-03-04 ENCOUNTER — Inpatient Hospital Stay: Payer: 59 | Admitting: Anesthesiology

## 2017-03-04 ENCOUNTER — Encounter: Payer: Self-pay | Admitting: Anesthesiology

## 2017-03-04 LAB — TYPE AND SCREEN
ABO/RH(D): A POS
Antibody Screen: NEGATIVE

## 2017-03-04 MED ORDER — ONDANSETRON HCL 4 MG/2ML IJ SOLN: 4.0000 mg | INTRAMUSCULAR | Status: DC | PRN

## 2017-03-04 MED ORDER — ONDANSETRON HCL 4 MG PO TABS: 4.0000 mg | ORAL_TABLET | ORAL | Status: DC | PRN

## 2017-03-04 MED ORDER — ACETAMINOPHEN 325 MG PO TABS: 650.0000 mg | ORAL_TABLET | ORAL | Status: DC | PRN

## 2017-03-04 MED ORDER — NALOXONE HCL 0.4 MG/ML IJ SOLN: 0.4000 mg | INTRAMUSCULAR | Status: DC | PRN

## 2017-03-04 MED ORDER — FENTANYL 2.5 MCG/ML W/ROPIVACAINE 0.2% IN NS 100 ML EPIDURAL INFUSION (ARMC-ANES): 10.0000 mL/h | EPIDURAL | Status: DC

## 2017-03-04 MED ORDER — LIDOCAINE HCL (PF) 1 % IJ SOLN
INTRAMUSCULAR | Status: DC | PRN
  Administered 2017-03-04: 3 mL via SUBCUTANEOUS

## 2017-03-04 MED ORDER — MEASLES, MUMPS & RUBELLA VAC ~~LOC~~ INJ
0.5000 mL | INJECTION | Freq: Once | SUBCUTANEOUS | Status: DC
  Filled 2017-03-04: qty 0.5

## 2017-03-04 MED ORDER — LIDOCAINE-EPINEPHRINE (PF) 1.5 %-1:200000 IJ SOLN
INTRAMUSCULAR | Status: DC | PRN
  Administered 2017-03-04: 3 mL via EPIDURAL

## 2017-03-04 MED ORDER — PRENATAL MULTIVITAMIN CH
1.0000 | ORAL_TABLET | Freq: Every day | ORAL | Status: DC
  Administered 2017-03-04 – 2017-03-05 (×2): 1 via ORAL
  Filled 2017-03-04 (×2): qty 1

## 2017-03-04 MED ORDER — MEPERIDINE HCL 25 MG/ML IJ SOLN: 6.2500 mg | INTRAMUSCULAR | Status: DC | PRN

## 2017-03-04 MED ORDER — MAGNESIUM HYDROXIDE 400 MG/5ML PO SUSP: 30.0000 mL | ORAL | Status: DC | PRN

## 2017-03-04 MED ORDER — BENZOCAINE-MENTHOL 20-0.5 % EX AERO
1.0000 | INHALATION_SPRAY | CUTANEOUS | Status: DC | PRN
Start: 2017-03-04 — End: 2017-03-05
  Filled 2017-03-04: qty 56

## 2017-03-04 MED ORDER — NALOXONE HCL 2 MG/2ML IJ SOSY
1.0000 ug/kg/h | PREFILLED_SYRINGE | INTRAVENOUS | Status: DC | PRN
  Filled 2017-03-04: qty 2

## 2017-03-04 MED ORDER — ONDANSETRON HCL 4 MG/2ML IJ SOLN: 4.0000 mg | Freq: Three times a day (TID) | INTRAMUSCULAR | Status: DC | PRN

## 2017-03-04 MED ORDER — KETOROLAC TROMETHAMINE 30 MG/ML IJ SOLN: 30.0000 mg | Freq: Four times a day (QID) | INTRAMUSCULAR | Status: AC | PRN

## 2017-03-04 MED ORDER — DIPHENHYDRAMINE HCL 25 MG PO CAPS: 25.0000 mg | ORAL_CAPSULE | Freq: Four times a day (QID) | ORAL | Status: DC | PRN

## 2017-03-04 MED ORDER — SIMETHICONE 80 MG PO CHEW: 80.0000 mg | CHEWABLE_TABLET | ORAL | Status: DC | PRN

## 2017-03-04 MED ORDER — COCONUT OIL OIL
1.0000 "application " | TOPICAL_OIL | Status: DC | PRN
  Administered 2017-03-04: 1 via TOPICAL
  Filled 2017-03-04: qty 120

## 2017-03-04 MED ORDER — NALBUPHINE HCL 10 MG/ML IJ SOLN: 5.0000 mg | INTRAMUSCULAR | Status: DC | PRN

## 2017-03-04 MED ORDER — FERROUS SULFATE 325 (65 FE) MG PO TABS
325.0000 mg | ORAL_TABLET | Freq: Two times a day (BID) | ORAL | Status: DC
  Administered 2017-03-04 – 2017-03-05 (×2): 325 mg via ORAL
  Filled 2017-03-04 (×2): qty 1

## 2017-03-04 MED ORDER — FENTANYL 2.5 MCG/ML W/ROPIVACAINE 0.2% IN NS 100 ML EPIDURAL INFUSION (ARMC-ANES)
EPIDURAL | Status: AC
  Filled 2017-03-04: qty 100

## 2017-03-04 MED ORDER — IBUPROFEN 600 MG PO TABS
600.0000 mg | ORAL_TABLET | Freq: Four times a day (QID) | ORAL | Status: DC
  Administered 2017-03-04 – 2017-03-05 (×4): 600 mg via ORAL
  Filled 2017-03-04 (×3): qty 1

## 2017-03-04 MED ORDER — SODIUM CHLORIDE 0.9% FLUSH: 3.0000 mL | INTRAVENOUS | Status: DC | PRN

## 2017-03-04 MED ORDER — SENNOSIDES-DOCUSATE SODIUM 8.6-50 MG PO TABS: 2.0000 | ORAL_TABLET | ORAL | Status: DC

## 2017-03-04 MED ORDER — IBUPROFEN 600 MG PO TABS
ORAL_TABLET | ORAL | Status: AC
  Filled 2017-03-04: qty 1

## 2017-03-04 MED ORDER — WITCH HAZEL-GLYCERIN EX PADS: 1.0000 "application " | MEDICATED_PAD | CUTANEOUS | Status: DC | PRN

## 2017-03-04 MED ORDER — NALBUPHINE HCL 10 MG/ML IJ SOLN: 5.0000 mg | Freq: Once | INTRAMUSCULAR | Status: DC | PRN

## 2017-03-04 MED ORDER — DIPHENHYDRAMINE HCL 25 MG PO CAPS: 25.0000 mg | ORAL_CAPSULE | ORAL | Status: DC | PRN

## 2017-03-04 MED ORDER — FENTANYL 2.5 MCG/ML W/ROPIVACAINE 0.2% IN NS 100 ML EPIDURAL INFUSION (ARMC-ANES)
EPIDURAL | Status: DC | PRN
  Administered 2017-03-04: 10 mL/h via EPIDURAL

## 2017-03-04 MED ORDER — ZOLPIDEM TARTRATE 5 MG PO TABS: 5.0000 mg | ORAL_TABLET | Freq: Every evening | ORAL | Status: DC | PRN

## 2017-03-04 MED ORDER — DIPHENHYDRAMINE HCL 50 MG/ML IJ SOLN: 12.5000 mg | INTRAMUSCULAR | Status: DC | PRN

## 2017-03-04 MED ORDER — HYDROCODONE-ACETAMINOPHEN 5-325 MG PO TABS: 1.0000 | ORAL_TABLET | ORAL | Status: DC | PRN

## 2017-03-04 MED ORDER — DIBUCAINE 1 % RE OINT: 1.0000 "application " | TOPICAL_OINTMENT | RECTAL | Status: DC | PRN

## 2017-03-04 MED ORDER — SODIUM CHLORIDE FLUSH 0.9 % IV SOLN
INTRAVENOUS | Status: AC
  Filled 2017-03-04: qty 30

## 2017-03-04 MED ORDER — SODIUM CHLORIDE 0.9 % IV SOLN
INTRAVENOUS | Status: DC | PRN
  Administered 2017-03-04 (×2): 4 mL via EPIDURAL

## 2017-03-04 NOTE — Discharge Summary (Signed)
Obstetric Discharge Summary   Patient ID: Patient Name: Jill SimsCrystal W Maldonado DOB: 09/17/1981 MRN: [id number]  Date of Admission: 03/03/2017 Date of Discharge: 03/05/2017 Date of Delivery 03/04/17 [facility]   Gestational Age at Delivery: 1923w6d   Antepartum complications: none Admitting Diagnosis:induction , elective  Secondary Diagnoses: Patient Active Problem List   Diagnosis Date Noted  . Pregnancy 03/03/2017    Augmentation: cervidil  Complications: None Intrapartum complications/course:   Date of Delivery: 03/04/17 Delivered By: Dr Feliberto GottronSchermerhorn Delivery Type: spontaneous vaginal delivery Anesthesia: epidural Placenta: spontaneous, loose nuchal cord  Laceration: second degree, repair uncomplicated. EBL 250 cc Episiotomy: none  Newborn Data: Live born female  Birth Weight: 8 lb 10.3 oz (3920 g) APGAR: 8, 9      Postpartum Course  Patient had an uncomplicated postpartum course.  By time of discharge on PPD#1 her pain was controlled on oral pain medications; she had appropriate lochia and was ambulating, voiding without difficulty and tolerating regular diet.  She was deemed stable for discharge to home.     Labs: CBC Latest Ref Rng & Units 03/05/2017 03/03/2017  WBC 3.6 - 11.0 K/uL 15.2(H) 11.3(H)  Hemoglobin 12.0 - 16.0 g/dL 4.0(J9.7(L) 11.2(L)  Hematocrit 35.0 - 47.0 % 29.2(L) 32.6(L)  Platelets 150 - 440 K/uL 194 210   A POS  Physical exam:  BP 102/69 (BP Location: Right Arm)   Pulse 73   Temp 97.9 F (36.6 C) (Oral)   Resp 19   Ht 5' (1.524 m)   Wt 67.6 kg (149 lb)   LMP 03/28/2016   SpO2 99%   BMI 29.10 kg/m  General: alert and no distress Pulm: normal respiratory effort Lochia: appropriate Abdomen: soft, NT Uterine Fundus: firm, below umbilicus Extremities: No evidence of DVT seen on physical exam. No lower extremity edema.   Disposition: stable, discharge to home Baby Feeding: breastmilk   Baby Disposition: home with  mom  Contraception: TBD  Prenatal Labs:  ABO, Rh: --/--/A POS (03/19 2302) Antibody: NEG (03/19 2302) Rubella: Immune (08/21 0000) RPR: Nonreactive (02/23 0000)  HBsAg: Negative (08/21 0000)  HIV: Non-reactive (08/21 0000)  GBS: Negative (02/23 0000)     Plan:  Leesha W Panameno was discharged to home in good condition. [facility] Discharge Instructions: Per After Visit Summary. Activity: Advance as tolerated. Pelvic rest for 6 weeks.  Refer to After Visit Summary Diet: Regular Discharge Medications: Allergies as of 03/05/2017      Reactions   Sulfa Antibiotics Nausea And Vomiting   Penicillins Rash      Medication List    STOP taking these medications   azithromycin 250 MG tablet Commonly known as:  ZITHROMAX Z-PAK     TAKE these medications   ferrous sulfate 325 (65 FE) MG tablet Take 325 mg by mouth daily with breakfast.   ibuprofen 600 MG tablet Commonly known as:  ADVIL,MOTRIN Take 1 tablet (600 mg total) by mouth every 6 (six) hours as needed.   metoprolol succinate 25 MG 24 hr tablet Commonly known as:  TOPROL-XL Take 25 mg by mouth daily.   MULTI VITAMIN PO Take 1 tablet by mouth daily.   omeprazole 40 MG capsule Commonly known as:  PRILOSEC Take 40 mg by mouth daily.   simethicone 80 MG chewable tablet Commonly known as:  MYLICON Chew 80 mg by mouth 4 (four) times daily as needed for flatulence.   ZINC ACETATE PO Take 1 capsule by mouth  daily.      Outpatient follow up:  Follow-up Information    SCHERMERHORN,THOMAS, MD Follow up in 6 week(s).   Specialty:  Obstetrics and Gynecology Contact information: [address] [place] [address] [phone]            Signed:  Elenora Fender Levaeh Vice 03/05/17

## 2017-03-04 NOTE — Anesthesia Preprocedure Evaluation (Signed)
Anesthesia Evaluation  Patient identified by MRN, date of birth, ID band Patient awake    Reviewed: Allergy & Precautions, H&P , NPO status , Patient's Chart, lab work & pertinent test results, reviewed documented beta blocker date and time   History of Anesthesia Complications Negative for: history of anesthetic complications  Airway Mallampati: II  TM Distance: >3 FB Neck ROM: full    Dental no notable dental hx. (+) Teeth Intact   Pulmonary neg pulmonary ROS,           Cardiovascular Exercise Tolerance: Good negative cardio ROS       Neuro/Psych neg Seizures  Neuromuscular disease (Sciatica and cervicalgia) negative psych ROS   GI/Hepatic Neg liver ROS, GERD  Medicated,  Endo/Other  negative endocrine ROS  Renal/GU negative Renal ROS  negative genitourinary   Musculoskeletal   Abdominal   Peds  Hematology  (+) Blood dyscrasia, anemia ,   Anesthesia Other Findings Past Medical History:   Anemia                                                       Anxiety                                                      Neuromuscular disorder (HCC)                                 Cervicalgia                                                  GERD (gastroesophageal reflux disease)                       Headache                                                     Sciatica                                                     Reproductive/Obstetrics (+) Pregnancy                             Anesthesia Physical  Anesthesia Plan  ASA: II  Anesthesia Plan: Epidural   Post-op Pain Management:    Induction:   Airway Management Planned:   Additional Equipment:   Intra-op Plan:   Post-operative Plan:   Informed Consent: I have reviewed the patients History and Physical, chart, labs and discussed the procedure including the risks, benefits and alternatives for the proposed anesthesia with the  patient or authorized representative who has indicated  his/her understanding and acceptance.   Dental Advisory Given  Plan Discussed with: Anesthesiologist, CRNA and Surgeon  Anesthesia Plan Comments:         Anesthesia Quick Evaluation

## 2017-03-04 NOTE — Anesthesia Procedure Notes (Signed)
Epidural Patient location during procedure: OB Start time: 03/04/2017 5:54 AM End time: 03/04/2017 6:03 AM  Staffing Anesthesiologist: Lenard SimmerKARENZ, Lynnita Somma Performed: anesthesiologist   Preanesthetic Checklist Completed: patient identified, site marked, surgical consent, pre-op evaluation, timeout performed, IV checked, risks and benefits discussed and monitors and equipment checked  Epidural Patient position: sitting Prep: ChloraPrep Patient monitoring: heart rate, continuous pulse ox and blood pressure Approach: midline Location: L3-L4 Injection technique: LOR saline  Needle:  Needle type: Tuohy  Needle gauge: 17 G Needle length: 9 cm and 9 Needle insertion depth: 4.5 cm Catheter type: closed end flexible Catheter size: 19 Gauge Catheter at skin depth: 8.5 cm Test dose: negative and 1.5% lidocaine with Epi 1:200 K  Assessment Sensory level: T10 Events: blood not aspirated, injection not painful, no injection resistance, negative IV test and no paresthesia  Additional Notes Pt. Evaluated and documentation done after procedure finished. Patient identified. Risks/Benefits/Options discussed with patient including but not limited to bleeding, infection, nerve damage, paralysis, failed block, incomplete pain control, headache, blood pressure changes, nausea, vomiting, reactions to medication both or allergic, itching and postpartum back pain. Confirmed with bedside nurse the patient's most recent platelet count. Confirmed with patient that they are not currently taking any anticoagulation, have any bleeding history or any family history of bleeding disorders. Patient expressed understanding and wished to proceed. All questions were answered. Sterile technique was used throughout the entire procedure. Please see nursing notes for vital signs. Test dose was given through epidural catheter and negative prior to continuing to dose epidural or start infusion. Warning signs of high block given to  the patient including shortness of breath, tingling/numbness in hands, complete motor block, or any concerning symptoms with instructions to call for help. Patient was given instructions on fall risk and not to get out of bed. All questions and concerns addressed with instructions to call with any issues or inadequate analgesia.   Patient tolerated the insertion well without immediate complications.Reason for block:procedure for pain

## 2017-03-04 NOTE — H&P (Signed)
Jill Andrews is a 36 y.o. female presenting for elective induction . Dr Dalbert GarnetBeasley scheduled . Marland Kitchen. OB History    Gravida Para Term Preterm AB Living   2 1 1  0 0 1   SAB TAB Ectopic Multiple Live Births                 Past Medical History:  Diagnosis Date  . Anemia    on Iron supplement presently  . Anxiety    has been treated in past for such, no meds currently  . Cervicalgia   . GERD (gastroesophageal reflux disease)    with or without pregnancy, on meds for such  . Headache    migraines related to oral Birth Control Estrogen, d/c'ed  . Irritable bowel syndrome (IBS)    when eatting well- "horrible cramps. diarrhea"  . Neuromuscular disorder (HCC)   . Sciatica    Lumbar 4-5, had steroid injections- relieved   Past Surgical History:  Procedure Laterality Date  . COLONOSCOPY WITH PROPOFOL N/A 02/05/2016   Procedure: COLONOSCOPY WITH PROPOFOL;  Surgeon: Elnita MaxwellMatthew Gordon Rein, MD;  [facility];  Service: Endoscopy;  Laterality: N/A;  . ESOPHAGOGASTRODUODENOSCOPY (EGD) WITH PROPOFOL N/A 02/05/2016   Procedure: ESOPHAGOGASTRODUODENOSCOPY (EGD) WITH PROPOFOL;  Surgeon: Elnita MaxwellMatthew Gordon Rein, MD;  [facility];  Service: Endoscopy;  Laterality: N/A;   Family History: family history is not on file. Social History:  reports that she has never smoked. She has never used smokeless tobacco. She reports that she does not drink alcohol or use drugs.     Maternal Diabetes: No Genetic Screening: Declined Maternal Ultrasounds/Referrals: Normal Fetal Ultrasounds or other Referrals:  None Maternal Substance Abuse:  No Significant Maternal Medications:  None Significant Maternal Lab Results:  None Other Comments:  None  ROS History Dilation: 5 Effacement (%): 100 Station: 0 Exam by:: Orie FishermanM Taylor  RN Blood pressure (!) 98/59, pulse 86, temperature 98.2 F (36.8 C), temperature source Oral, resp. rate 18, height 5' (1.524 m), weight 149 lb (67.6 kg), last menstrual period  03/28/2016. Exam Physical Exam  Lungs CTA  cv RRR  Abd soft NT  EFM : reassuring  Prenatal labs: ABO, Rh: --/--/A POS (03/19 2302) Antibody: NEG (03/19 2302) Rubella: Immune (08/21 0000) RPR: Nonreactive (02/23 0000)  HBsAg: Negative (08/21 0000)  HIV: Non-reactive (08/21 0000)  GBS: Negative (02/23 0000)   Assessment/Plan: Term Induction  Cervidil now out  CLE requested    SCHERMERHORN,THOMAS 03/04/2017, 5:44 AM

## 2017-03-05 LAB — CBC
HCT: 29.2 % — ABNORMAL LOW (ref 35.0–47.0)
Hemoglobin: 9.7 g/dL — ABNORMAL LOW (ref 12.0–16.0)
MCH: 31 pg (ref 26.0–34.0)
MCHC: 33.2 g/dL (ref 32.0–36.0)
MCV: 93.5 fL (ref 80.0–100.0)
PLATELETS: 194 10*3/uL (ref 150–440)
RBC: 3.13 MIL/uL — ABNORMAL LOW (ref 3.80–5.20)
RDW: 14.8 % — AB (ref 11.5–14.5)
WBC: 15.2 10*3/uL — AB (ref 3.6–11.0)

## 2017-03-05 LAB — RPR: RPR: NONREACTIVE

## 2017-03-05 MED ORDER — IBUPROFEN 600 MG PO TABS: 600.0000 mg | ORAL_TABLET | Freq: Four times a day (QID) | ORAL | 0 refills | Status: DC | PRN

## 2017-03-05 NOTE — Progress Notes (Signed)
Patient discharged home with infant and significant other. Discharge instructions, prescriptions and follow up appointment given to and reviewed with patient and significant other. Patient verbalized understanding. Escorted out via wheelchair by auxiliary.  

## 2017-03-05 NOTE — Anesthesia Postprocedure Evaluation (Signed)
Anesthesia Post Note  Patient: Jill Andrews  Procedure(s) Performed: * No procedures listed *  Patient location during evaluation: Mother Baby Anesthesia Type: Epidural Level of consciousness: awake and alert and oriented Pain management: pain level controlled Vital Signs Assessment: post-procedure vital signs reviewed and stable Respiratory status: spontaneous breathing Cardiovascular status: stable Postop Assessment: no signs of nausea or vomiting and adequate PO intake Anesthetic complications: no     Last Vitals:  Vitals:   03/04/17 2341 03/05/17 0300  BP: (!) 96/59 (!) 98/59  Pulse: 72 76  Resp: 20 18  Temp: 36.9 C 36.9 C    Last Pain:  Vitals:   03/05/17 0401  TempSrc:   PainSc: 0-No pain                 Anamari Galeas,  Alessandra BevelsJennifer M

## 2017-07-28 DIAGNOSIS — H52223 Regular astigmatism, bilateral: Secondary | ICD-10-CM | POA: Diagnosis not present

## 2017-07-28 DIAGNOSIS — H47322 Drusen of optic disc, left eye: Secondary | ICD-10-CM | POA: Diagnosis not present

## 2017-07-28 DIAGNOSIS — H5211 Myopia, right eye: Secondary | ICD-10-CM | POA: Diagnosis not present

## 2017-08-28 ENCOUNTER — Encounter: Payer: Self-pay | Admitting: Internal Medicine

## 2017-08-28 ENCOUNTER — Ambulatory Visit (INDEPENDENT_AMBULATORY_CARE_PROVIDER_SITE_OTHER): Payer: 59 | Admitting: Internal Medicine

## 2017-08-28 VITALS — BP 104/60 | HR 79 | Ht 60.0 in | Wt 123.2 lb

## 2017-08-28 DIAGNOSIS — R000 Tachycardia, unspecified: Secondary | ICD-10-CM | POA: Diagnosis not present

## 2017-08-28 DIAGNOSIS — R002 Palpitations: Secondary | ICD-10-CM

## 2017-08-28 NOTE — Patient Instructions (Signed)
Medication Instructions: - Your physician recommends that you continue on your current medications as directed. Please refer to the Current Medication list given to you today.  Labwork: - none ordered  Procedures/Testing: - Your physician has recommended that you wear a 48 hour holter monitor. Holter monitors are medical devices that record the heart's electrical activity. Doctors most often use these monitors to diagnose arrhythmias. Arrhythmias are problems with the speed or rhythm of the heartbeat. The monitor is a small, portable device. You can wear one while you do your normal daily activities. This is usually used to diagnose what is causing palpitations/syncope (passing out).  Follow-Up: - Your physician recommends that you schedule a follow-up appointment in: about 1- 2 weeks with Dr. Graciela HusbandsKlein after the holter monitor is worn.    Any Additional Special Instructions Will Be Listed Below (If Applicable). - Increase salt & water intake    If you need a refill on your cardiac medications before your next appointment, please call your pharmacy.

## 2017-08-28 NOTE — Progress Notes (Signed)
ELECTROPHYSIOLOGY CONSULT NOTE  Patient ID: Jill Andrews, MRN: [id number], DOB/AGE: 35-17-1982 35 y.o. Admit date: (Not on file) Date of Consult: 08/28/2017  Primary Physician: Marguarite Arbour, MD Primary Cardiologist: BK(KC)   Jill Sims Gordon is a 36 y.o. female who is being seen today for the evaluation of palps at the request of herself.    HPI Ona W Andrews is a 36 y.o. female  Has a one-year history of recurrent abrupt onset offset tachypalpitations. They're frequently triggered by even minimal activity and/or stress. It can last minutes. She does not have heat intolerance or shower intolerance.She Is significantly not fit.  However, she also has episodes where her heart rate goes very fast with even minimal exercise and happens repeatedly during the day.  Orthostatic vital signs today, she had recurrence of her dizziness.  I reviewed the Holter monitor and stress test from 7/17. Unfortunately she was pregnant at that time the hard to interpret. Monitor demonstrated no tachycardia. She did have PACs and atrial and ventricular couplets     Echo 10/16 EF normal  TSH 7/17 1.77 K 7/17 3.4 Hgb 2/18  10.9  Past Medical History:  Diagnosis Date  . Anemia    on Iron supplement presently  . Anxiety    has been treated in past for such, no meds currently  . Cervicalgia   . GERD (gastroesophageal reflux disease)    with or without pregnancy, on meds for such  . Headache    migraines related to oral Birth Control Estrogen, d/c'ed  . Irritable bowel syndrome (IBS)    when eatting well- "horrible cramps. diarrhea"  . Neuromuscular disorder (HCC)   . Sciatica    Lumbar 4-5, had steroid injections- relieved  . SVT (supraventricular tachycardia) [facility])       Surgical History:  Past Surgical History:  Procedure Laterality Date  . COLONOSCOPY WITH PROPOFOL N/A 02/05/2016   Procedure: COLONOSCOPY WITH PROPOFOL;  Surgeon: Elnita Maxwell, MD;  Location[facility]  ENDOSCOPY;  Service: Endoscopy;  Laterality: N/A;  . ESOPHAGOGASTRODUODENOSCOPY (EGD) WITH PROPOFOL N/A 02/05/2016   Procedure: ESOPHAGOGASTRODUODENOSCOPY (EGD) WITH PROPOFOL;  Surgeon: Elnita Maxwell, MD;  Location: [facility] ENDOSCOPY;  Service: Endoscopy;  Laterality: N/A;     Home Meds: Prior to Admission medications   Medication Sig Start Date End Date Taking? Authorizing Provider  ferrous sulfate 325 (65 FE) MG tablet Take 325 mg by mouth daily with breakfast.    [provider]  ibuprofen (ADVIL,MOTRIN) 600 MG tablet Take 1 tablet (600 mg total) by mouth every 6 (six) hours as needed. 03/05/17   Ward, Elenora Fender, MD  metoprolol succinate (TOPROL-XL) 25 MG 24 hr tablet Take 25 mg by mouth daily.    [provider]  Multiple Vitamin (MULTI VITAMIN PO) Take 1 tablet by mouth daily.    [provider]  omeprazole (PRILOSEC) 40 MG capsule Take 40 mg by mouth daily.    [provider]  simethicone (MYLICON) 80 MG chewable tablet Chew 80 mg by mouth 4 (four) times daily as needed for flatulence.    [provider]  Zinc Acetate, Oral, (ZINC ACETATE PO) Take 1 capsule by mouth daily.    [provider]    Allergies:  Allergies  Allergen Reactions  . Sulfa Antibiotics Nausea And Vomiting  . Penicillins Rash    Social History   Social History  . Marital status: Married    Spouse name: N/A  . Number of  children: N/A  . Years of education: N/A   Occupational History  . Not on file.   Social History Main Topics  . Smoking status: Never Smoker  . Smokeless tobacco: Never Used  . Alcohol use No  . Drug use: No  . Sexual activity: Yes    Birth control/ protection: None   Other Topics Concern  . Not on file   Social History Narrative  . No narrative on file     Family History  Problem Relation Age of Onset  . Heart murmur Father   . Hyperlipidemia Mother   . Heart Problems Maternal Grandmother   . Heart Problems Maternal  Grandfather   . Heart Problems Paternal Grandmother   . Heart failure Paternal Grandmother   . Heart Problems Paternal Grandfather      ROS:  Please see the history of present illness.     All other systems reviewed and negative.    Physical Exam:  Blood pressure 104/60, pulse 79, height 5' (1.524 m), weight 123 lb 4 oz (55.9 kg), unknown if currently breastfeeding. General: Well developed, well nourished female in no acute distress. Head: Normocephalic, atraumatic, sclera non-icteric, no xanthomas, nares are without discharge. EENT: normal  Lymph Nodes:  none Neck: Negative for carotid bruits. JVD not elevated. Back:without scoliosis kyphosis Lungs: Clear bilaterally to auscultation without wheezes, rales, or rhonchi. Breathing is unlabored. Heart: RRR with S1 S2. no murmur . No rubs, or gallops appreciated. Abdomen: Soft, non-tender, non-distended with normoactive bowel sounds. No hepatomegaly. No rebound/guarding. No obvious abdominal masses. Msk:  Strength and tone appear normal for age. Extremities: No clubbing or cyanosis. No edema.  Distal pedal pulses are 2+ and equal bilaterally. Skin: Warm and Dry Neuro: Alert and oriented X 3. CN III-XII intact Grossly normal sensory and motor function . Psych:  Responds to questions appropriately with a normal affect.      Labs: Cardiac Enzymes No results for input(s): CKTOTAL, CKMB, TROPONINI in the last 72 hours. CBC Lab Results  Component Value Date   WBC 15.2 (H) 03/05/2017   HGB 9.7 (L) 03/05/2017   HCT 29.2 (L) 03/05/2017   MCV 93.5 03/05/2017   PLT 194 03/05/2017   PROTIME: No results for input(s): LABPROT, INR in the last 72 hours. Chemistry No results for input(s): NA, K, CL, CO2, BUN, CREATININE, CALCIUM, PROT, BILITOT, ALKPHOS, ALT, AST, GLUCOSE in the last 168 hours.  Invalid input(s): LABALBU Lipids No results found for: CHOL, HDL, LDLCALC, TRIG BNP No results found for: PROBNP Thyroid Function Tests: No  results for input(s): TSH, T4TOTAL, T3FREE, THYROIDAB in the last 72 hours.  Invalid input(s): FREET3 Miscellaneous No results found for: DDIMER  Radiology/Studies:  No results found.  EKG: sinus without preexcitation    Assessment and Plan:  Tachycardia   Her symptoms suggest both an arrhythmia and secondary and the monitoring to date is insufficiently revelaing not to mention that it occcured during the early stages of pregnancy.    While it revealed PACs PVC and couplets, no specific mechanism noted, so will repeat monitoring-- we discussed ablation if arrhythmia noted  We discussed extensively the issues of dysautonomia, the physiology of orthstasis and positional stress.  We discussed the role of salt and water repletion, the importance of exercise, often needing to be started in the recumbent position, and the awareness of triggers and the role of ambient heat and dehydration    Sherryl MangesSteven Kensley Lares

## 2017-09-03 ENCOUNTER — Ambulatory Visit (INDEPENDENT_AMBULATORY_CARE_PROVIDER_SITE_OTHER): Payer: 59

## 2017-09-03 DIAGNOSIS — R000 Tachycardia, unspecified: Secondary | ICD-10-CM | POA: Diagnosis not present

## 2017-09-03 DIAGNOSIS — R002 Palpitations: Secondary | ICD-10-CM

## 2017-09-11 DIAGNOSIS — R000 Tachycardia, unspecified: Secondary | ICD-10-CM | POA: Diagnosis not present

## 2017-09-16 ENCOUNTER — Encounter: Payer: Self-pay | Admitting: Internal Medicine

## 2017-09-16 ENCOUNTER — Ambulatory Visit (INDEPENDENT_AMBULATORY_CARE_PROVIDER_SITE_OTHER): Payer: 59 | Admitting: Internal Medicine

## 2017-09-16 VITALS — BP 102/60 | HR 75 | Ht 60.0 in | Wt 123.2 lb

## 2017-09-16 DIAGNOSIS — R002 Palpitations: Secondary | ICD-10-CM | POA: Diagnosis not present

## 2017-09-16 DIAGNOSIS — R000 Tachycardia, unspecified: Secondary | ICD-10-CM | POA: Diagnosis not present

## 2017-09-16 NOTE — Progress Notes (Signed)
Patient Care Team: Marguarite Arbour, MD as PCP - General (Internal Medicine)   HPI  Jill Andrews is a 36 y.o. female Seen in follow-up for palpitations with 2 different syndromes. The first R skips. The second are racing. She has noted since our last visit that her heart races in the shower. She also notes associated with exertion.  Her 48 hour recorder was associated with typical symptoms. They showed sinus rhythm with most of her symptoms. There was a rare PVC.  Her diet is fluid deplete of salt deplete  Records and Results Reviewed As above  Echo 10/16 EF normal  TSH 7/17 1.77 K 7/17 3.4 Hgb 2/18  10.9  Past Medical History:  Diagnosis Date  . Anemia    on Iron supplement presently  . Anxiety    has been treated in past for such, no meds currently  . Cervicalgia   . GERD (gastroesophageal reflux disease)    with or without pregnancy, on meds for such  . Headache    migraines related to oral Birth Control Estrogen, d/c'ed  . Irritable bowel syndrome (IBS)    when eatting well- "horrible cramps. diarrhea"  . Neuromuscular disorder (HCC)   . Sciatica    Lumbar 4-5, had steroid injections- relieved  . SVT (supraventricular tachycardia) (HCC)     Past Surgical History:  Procedure Laterality Date  . COLONOSCOPY WITH PROPOFOL N/A 02/05/2016   Procedure: COLONOSCOPY WITH PROPOFOL;  Surgeon: Elnita Maxwell, MD;  Location: [facility] ENDOSCOPY;  Service: Endoscopy;  Laterality: N/A;  . ESOPHAGOGASTRODUODENOSCOPY (EGD) WITH PROPOFOL N/A 02/05/2016   Procedure: ESOPHAGOGASTRODUODENOSCOPY (EGD) WITH PROPOFOL;  Surgeon: Elnita Maxwell, MD;  Location: [facility] ENDOSCOPY;  Service: Endoscopy;  Laterality: N/A;    Current Outpatient Prescriptions  Medication Sig Dispense Refill  . ibuprofen (ADVIL,MOTRIN) 600 MG tablet Take 1 tablet (600 mg total) by mouth every 6 (six) hours as needed. 30 tablet 0  . Multiple Vitamin (MULTI VITAMIN PO) Take 1 tablet by mouth daily.      . rizatriptan (MAXALT) 10 MG tablet Take 10 mg by mouth as needed for migraine. May repeat in 2 hours if needed     No current facility-administered medications for this visit.     Allergies  Allergen Reactions  . Sulfa Antibiotics Nausea And Vomiting  . Penicillins Rash      Review of Systems negative except from HPI and PMH  Physical Exam BP 102/60 (BP Location: Left Arm, Patient Position: Sitting, Cuff Size: Normal)   Pulse 75   Ht 5' (1.524 m)   Wt 123 lb 4 oz (55.9 kg)   BMI 24.07 kg/m  Well developed and nourished in no acute distress HENT normal Neck supple with JVP-flat Carotids brisk and full without bruits Clear Regular rate and rhythm, no murmurs or gallops Abd-soft with active BS without hepatomegaly No Clubbing cyanosis edema Skin-warm and dry A & Oriented  Grossly normal sensory and motor function  Sinus rhythm at 75 Intervals 15/08/36 Otherwise normal  Assessment and  Plan  Dysautonomia  PVCs  She is dysautonomia with primarily a postural tachycardia  We discussed extensively the issues of dysautonomia, the physiology of orthstasis and positional stress.  We discussed the role of salt and water repletion, the importance of exercise, often needing to be started in the recumbent position, and the awareness of triggers and the role of ambient heat and dehydration   Her menses are still irregular following the birth of  her most recent child  We spent more than 50% of our >25 min visit in face to face counseling regarding the above        Current medicines are reviewed at length with the patient today .  The patient does not  have concerns regarding medicines.

## 2017-09-16 NOTE — Patient Instructions (Addendum)
Medication Instructions: - Your physician recommends that you continue on your current medications as directed. Please refer to the Current Medication list given to you today.  Labwork: - none ordered  Procedures/Testing: - none ordered  Follow-Up: - Your physician wants you to follow-up in: 6 months with Dr. Graciela Husbands. You will receive a reminder letter in the mail two months in advance. If you don't receive a letter, please call our office to schedule the follow-up appointment.   Any Additional Special Instructions Will Be Listed Below (If Applicable). - salt tablets - increase fluids   If you need a refill on your cardiac medications before your next appointment, please call your pharmacy.

## 2017-09-23 DIAGNOSIS — Z23 Encounter for immunization: Secondary | ICD-10-CM | POA: Diagnosis not present

## 2017-09-23 NOTE — Addendum Note (Signed)
Addended by: Kendrick Fries on: 09/23/2017 07:21 AM   Modules accepted: Orders

## 2017-11-20 DIAGNOSIS — I471 Supraventricular tachycardia: Secondary | ICD-10-CM | POA: Diagnosis not present

## 2017-11-20 DIAGNOSIS — Z79899 Other long term (current) drug therapy: Secondary | ICD-10-CM | POA: Diagnosis not present

## 2017-11-20 DIAGNOSIS — F419 Anxiety disorder, unspecified: Secondary | ICD-10-CM | POA: Diagnosis not present

## 2017-11-20 DIAGNOSIS — D649 Anemia, unspecified: Secondary | ICD-10-CM | POA: Diagnosis not present

## 2018-04-13 ENCOUNTER — Ambulatory Visit (INDEPENDENT_AMBULATORY_CARE_PROVIDER_SITE_OTHER): Payer: Self-pay | Admitting: Family Medicine

## 2018-04-13 ENCOUNTER — Encounter: Payer: Self-pay | Admitting: Family Medicine

## 2018-04-13 VITALS — BP 120/80 | HR 74 | Temp 98.5°F | Wt 129.0 lb

## 2018-04-13 DIAGNOSIS — J309 Allergic rhinitis, unspecified: Secondary | ICD-10-CM

## 2018-04-13 DIAGNOSIS — J029 Acute pharyngitis, unspecified: Secondary | ICD-10-CM

## 2018-04-13 LAB — POCT RAPID STREP A (OFFICE): Rapid Strep A Screen: NEGATIVE

## 2018-04-13 MED ORDER — IPRATROPIUM BROMIDE 0.03 % NA SOLN: 2.0000 | Freq: Two times a day (BID) | NASAL | 0 refills | Status: DC

## 2018-04-13 MED ORDER — LEVOCETIRIZINE DIHYDROCHLORIDE 5 MG PO TABS: 5.0000 mg | ORAL_TABLET | Freq: Every evening | ORAL | 0 refills | Status: DC

## 2018-04-13 MED ORDER — PREDNISONE 20 MG PO TABS: 40.0000 mg | ORAL_TABLET | Freq: Every day | ORAL | 0 refills | Status: DC

## 2018-04-13 NOTE — Patient Instructions (Signed)
If no improvement with prescribed therapy, return for follow-up!   Sore Throat When you have a sore throat, your throat may:  Hurt.  Burn.  Feel irritated.  Feel scratchy.  Many things can cause a sore throat, including:  An infection.  Allergies.  Dryness in the air.  Smoke or pollution.  Gastroesophageal reflux disease (GERD).  A tumor.  A sore throat can be the first sign of another sickness. It can happen with other problems, like coughing or a fever. Most sore throats go away without treatment. Follow these instructions at home:  Take over-the-counter medicines only as told by your doctor.  Drink enough fluids to keep your pee (urine) clear or pale yellow.  Rest when you feel you need to.  To help with pain, try: ? Sipping warm liquids, such as broth, herbal tea, or warm water. ? Eating or drinking cold or frozen liquids, such as frozen ice pops. ? Gargling with a salt-water mixture 3-4 times a day or as needed. To make a salt-water mixture, add -1 tsp of salt in 1 cup of warm water. Mix it until you cannot see the salt anymore. ? Sucking on hard candy or throat lozenges. ? Putting a cool-mist humidifier in your bedroom at night. ? Sitting in the bathroom with the door closed for 5-10 minutes while you run hot water in the shower.  Do not use any tobacco products, such as cigarettes, chewing tobacco, and e-cigarettes. If you need help quitting, ask your doctor. Contact a doctor if:  You have a fever for more than 2-3 days.  You keep having symptoms for more than 2-3 days.  Your throat does not get better in 7 days.  You have a fever and your symptoms suddenly get worse. Get help right away if:  You have trouble breathing.  You cannot swallow fluids, soft foods, or your saliva.  You have swelling in your throat or neck that gets worse.  You keep feeling like you are going to throw up (vomit).  You keep throwing up. This information is not  intended to replace advice given to you by your health care provider. Make sure you discuss any questions you have with your health care provider. Document Released: 09/10/2008 Document Revised: 07/28/2016 Document Reviewed: 09/22/2015 Elsevier Interactive Patient Education  2018 [employer].  Allergies An allergy is when your body reacts to a substance in a way that is not normal. An allergic reaction can happen after you:  Eat something.  Breathe in something.  Touch something.  You can be allergic to:  Things that are only around during certain seasons, like molds and pollens.  Foods.  Drugs.  Insects.  Animal dander.  What are the signs or symptoms?  Puffiness (swelling). This may happen on the lips, face, tongue, mouth, or throat.  Sneezing.  Coughing.  Breathing loudly (wheezing).  Stuffy nose.  Tingling in the mouth.  A rash.  Itching.  Itchy, red, puffy areas of skin (hives).  Watery eyes.  Throwing up (vomiting).  Watery poop (diarrhea).  Dizziness.  Feeling faint or fainting.  Trouble breathing or swallowing.  A tight feeling in the chest.  A fast heartbeat. How is this diagnosed? Allergies can be diagnosed with:  A medical and family history.  Skin tests.  Blood tests.  A food diary. A food diary is a record of all the foods, drinks, and symptoms you have each day.  The results of an elimination diet. This diet involves making  sure not to eat certain foods and then seeing what happens when you start eating them again.  How is this treated? There is no cure for allergies, but allergic reactions can be treated with medicine. Severe reactions usually need to be treated at a hospital. How is this prevented? The best way to prevent an allergic reaction is to avoid the thing you are allergic to. Allergy shots and medicines can also help prevent reactions in some cases. This information is not intended to replace advice given to you  by your health care provider. Make sure you discuss any questions you have with your health care provider. Document Released: 03/29/2013 Document Revised: 07/29/2016 Document Reviewed: 09/13/2014 Elsevier Interactive Patient Education  [employer].

## 2018-04-13 NOTE — Progress Notes (Signed)
Patient ID: Jill Andrews, female    DOB: 1981-01-11, 37 y.o.   MRN: [id number]  PCP: Marguarite Arbour, MD  Chief Complaint  Patient presents with  . choice-sorethroat    Subjective:  HPI Verdelle W Andrews is a 37 y.o. female presents for evaluation sore throat and congestion x 4 days. She reports no prior history of allergies although reports sensitivity to recent increase level of environmental pollen. She reports discomfort with swallowing, post nasal drainage, and nasal congestion. Denies cough, headache, fever, or GI symptoms. She has attempted relief with throat lozenges and  Sudafed only. She is not currently prescribed chronic antihistamine therapy. Social History   Socioeconomic History  . Marital status: Married    Spouse name: Not on file  . Number of children: Not on file  . Years of education: Not on file  . Highest education level: Not on file  Occupational History  . Not on file  Social Needs  . Financial resource strain: Not on file  . Food insecurity:    Worry: Not on file    Inability: Not on file  . Transportation needs:    Medical: Not on file    Non-medical: Not on file  Tobacco Use  . Smoking status: Never Smoker  . Smokeless tobacco: Never Used  Substance and Sexual Activity  . Alcohol use: No  . Drug use: No  . Sexual activity: Yes    Birth control/protection: None  Lifestyle  . Physical activity:    Days per week: Not on file    Minutes per session: Not on file  . Stress: Not on file  Relationships  . Social connections:    Talks on phone: Not on file    Gets together: Not on file    Attends religious service: Not on file    Active member of club or organization: Not on file    Attends meetings of clubs or organizations: Not on file    Relationship status: Not on file  . Intimate partner violence:    Fear of current or ex partner: Not on file    Emotionally abused: Not on file    Physically abused: Not on file    Forced sexual activity:  Not on file  Other Topics Concern  . Not on file  Social History Narrative  . Not on file    Family History  Problem Relation Age of Onset  . Heart murmur Father   . Hyperlipidemia Mother   . Heart Problems Maternal Grandmother   . Heart Problems Maternal Grandfather   . Heart Problems Paternal Grandmother   . Heart failure Paternal Grandmother   . Heart Problems Paternal Grandfather    Review of Systems Pertinent negatives listed in HPI  Patient Active Problem List   Diagnosis Date Noted  . Pregnancy 03/03/2017    Allergies  Allergen Reactions  . Sulfa Antibiotics Nausea And Vomiting  . Penicillins Rash    Prior to Admission medications   Medication Sig Start Date End Date Taking? Authorizing Provider  ibuprofen (ADVIL,MOTRIN) 600 MG tablet Take 1 tablet (600 mg total) by mouth every 6 (six) hours as needed. 03/05/17  Yes Ward, Elenora Fender, MD  Multiple Vitamin (MULTI VITAMIN PO) Take 1 tablet by mouth daily.   Yes [provider]  PARoxetine (PAXIL) 20 MG tablet  04/03/18  Yes [provider]  norethindrone-ethinyl estradiol (MICROGESTIN,JUNEL,LOESTRIN) 1-20 MG-MCG tablet  01/20/18   [provider]  rizatriptan (MAXALT) 10 MG tablet  Take 10 mg by mouth as needed for migraine. May repeat in 2 hours if needed    [provider]    Past Medical, Surgical Family and Social History reviewed and updated.    Objective:   Today's Vitals   04/13/18 0910  BP: 120/80  Pulse: 74  Temp: 98.5 F (36.9 C)  SpO2: 99%  Weight: 129 lb (58.5 kg)    Wt Readings from Last 3 Encounters:  04/13/18 129 lb (58.5 kg)  09/16/17 123 lb 4 oz (55.9 kg)  08/28/17 123 lb 4 oz (55.9 kg)   Physical Exam  Constitutional: She appears well-developed and well-nourished.  HENT:  Nose: Mucosal edema and rhinorrhea present. Right sinus exhibits no maxillary sinus tenderness and no frontal sinus tenderness. Left sinus exhibits no maxillary sinus tenderness and no  frontal sinus tenderness.  Mouth/Throat: Posterior oropharyngeal erythema present. No oropharyngeal exudate or posterior oropharyngeal edema.  Cardiovascular: Regular rhythm and normal pulses.  Pulmonary/Chest: Effort normal.  Lymphadenopathy:    She has no cervical adenopathy.  Skin: Skin is warm and dry.  Psychiatric: She has a normal mood and affect. Her behavior is normal. Thought content normal.   Assessment & Plan:  1. Allergic sinusitis 2. Sore throat Illness is likely viral in nature stemming from environmental allergens.  Therefore antibiotic therapy is not indicated.  Will treat symptoms only.  See medication orders.  Patient advised if no improvement return for further evaluation.   Meds ordered this encounter  Medications  . predniSONE (DELTASONE) 20 MG tablet    Sig: Take 2 tablets (40 mg total) by mouth daily with breakfast.    Dispense:  10 tablet    Refill:  0  . levocetirizine (XYZAL) 5 MG tablet    Sig: Take 1 tablet (5 mg total) by mouth every evening.    Dispense:  60 tablet    Refill:  0  . ipratropium (ATROVENT) 0.03 % nasal spray    Sig: Place 2 sprays into both nostrils 2 (two) times daily.    Dispense:  30 mL    Refill:  0     If symptoms worsen or do not improve, return for follow-up, follow-up with PCP, or at the emergency department if severity of symptoms warrant a higher level of care.    Godfrey Pick. Tiburcio Pea, MSN, FNP-C [facility]  [address]  [place], [place] [address] [phone]

## 2019-02-19 DIAGNOSIS — Z79899 Other long term (current) drug therapy: Secondary | ICD-10-CM | POA: Diagnosis not present

## 2019-02-19 DIAGNOSIS — I471 Supraventricular tachycardia: Secondary | ICD-10-CM | POA: Diagnosis not present

## 2019-02-19 DIAGNOSIS — Z13220 Encounter for screening for lipoid disorders: Secondary | ICD-10-CM | POA: Diagnosis not present

## 2019-03-05 DIAGNOSIS — F419 Anxiety disorder, unspecified: Secondary | ICD-10-CM | POA: Diagnosis not present

## 2019-03-05 DIAGNOSIS — Z0000 Encounter for general adult medical examination without abnormal findings: Secondary | ICD-10-CM | POA: Diagnosis not present

## 2019-03-05 DIAGNOSIS — I471 Supraventricular tachycardia: Secondary | ICD-10-CM | POA: Diagnosis not present

## 2019-03-05 DIAGNOSIS — F329 Major depressive disorder, single episode, unspecified: Secondary | ICD-10-CM | POA: Diagnosis not present

## 2020-05-01 ENCOUNTER — Other Ambulatory Visit: Payer: Self-pay | Admitting: Family Medicine

## 2020-05-01 DIAGNOSIS — J029 Acute pharyngitis, unspecified: Secondary | ICD-10-CM | POA: Diagnosis not present

## 2020-05-01 DIAGNOSIS — F329 Major depressive disorder, single episode, unspecified: Secondary | ICD-10-CM | POA: Diagnosis not present

## 2020-05-01 DIAGNOSIS — R0982 Postnasal drip: Secondary | ICD-10-CM | POA: Diagnosis not present

## 2020-05-01 DIAGNOSIS — F419 Anxiety disorder, unspecified: Secondary | ICD-10-CM | POA: Diagnosis not present

## 2020-10-31 ENCOUNTER — Other Ambulatory Visit: Payer: Self-pay | Admitting: Internal Medicine

## 2020-10-31 DIAGNOSIS — F32A Depression, unspecified: Secondary | ICD-10-CM | POA: Diagnosis not present

## 2020-10-31 DIAGNOSIS — R002 Palpitations: Secondary | ICD-10-CM | POA: Diagnosis not present

## 2020-10-31 DIAGNOSIS — F419 Anxiety disorder, unspecified: Secondary | ICD-10-CM | POA: Diagnosis not present

## 2020-12-25 ENCOUNTER — Other Ambulatory Visit: Payer: Self-pay | Admitting: Internal Medicine

## 2021-01-31 ENCOUNTER — Telehealth: Payer: Self-pay | Admitting: *Deleted

## 2021-01-31 NOTE — Telephone Encounter (Signed)
Contacted patient regarding recall appointment, patient notified our office they did not wish to keep this appointment at this time.  Deleted recall from system. °

## 2021-04-03 MED FILL — Venlafaxine HCl Cap ER 24HR 150 MG (Base Equivalent): ORAL | 30 days supply | Qty: 30 | Fill #0 | Status: AC

## 2021-04-04 ENCOUNTER — Other Ambulatory Visit: Payer: Self-pay

## 2021-04-27 DIAGNOSIS — Z79899 Other long term (current) drug therapy: Secondary | ICD-10-CM | POA: Diagnosis not present

## 2021-04-27 DIAGNOSIS — R8290 Unspecified abnormal findings in urine: Secondary | ICD-10-CM | POA: Diagnosis not present

## 2021-04-27 DIAGNOSIS — Z13220 Encounter for screening for lipoid disorders: Secondary | ICD-10-CM | POA: Diagnosis not present

## 2021-04-27 DIAGNOSIS — N926 Irregular menstruation, unspecified: Secondary | ICD-10-CM | POA: Diagnosis not present

## 2021-05-02 ENCOUNTER — Other Ambulatory Visit: Payer: Self-pay

## 2021-05-02 MED FILL — Venlafaxine HCl Cap ER 24HR 150 MG (Base Equivalent): ORAL | 30 days supply | Qty: 30 | Fill #1 | Status: AC

## 2021-05-04 ENCOUNTER — Other Ambulatory Visit: Payer: Self-pay

## 2021-05-04 DIAGNOSIS — I471 Supraventricular tachycardia: Secondary | ICD-10-CM | POA: Diagnosis not present

## 2021-05-04 DIAGNOSIS — F32A Depression, unspecified: Secondary | ICD-10-CM | POA: Diagnosis not present

## 2021-05-04 DIAGNOSIS — F419 Anxiety disorder, unspecified: Secondary | ICD-10-CM | POA: Diagnosis not present

## 2021-05-04 MED ORDER — PHENTERMINE HCL 37.5 MG PO TABS
ORAL_TABLET | ORAL | 2 refills | Status: DC
  Filled 2021-05-04: qty 30, 30d supply, fill #0

## 2021-05-25 DIAGNOSIS — N920 Excessive and frequent menstruation with regular cycle: Secondary | ICD-10-CM | POA: Diagnosis not present

## 2021-05-25 DIAGNOSIS — Z124 Encounter for screening for malignant neoplasm of cervix: Secondary | ICD-10-CM | POA: Diagnosis not present

## 2021-05-25 DIAGNOSIS — Z01419 Encounter for gynecological examination (general) (routine) without abnormal findings: Secondary | ICD-10-CM | POA: Diagnosis not present

## 2021-06-04 ENCOUNTER — Other Ambulatory Visit: Payer: Self-pay

## 2021-06-04 MED FILL — Venlafaxine HCl Cap ER 24HR 150 MG (Base Equivalent): ORAL | 30 days supply | Qty: 30 | Fill #2 | Status: AC

## 2021-06-06 ENCOUNTER — Telehealth: Payer: Self-pay

## 2021-06-06 NOTE — Telephone Encounter (Signed)
Wrong patient

## 2021-06-10 DIAGNOSIS — R000 Tachycardia, unspecified: Secondary | ICD-10-CM | POA: Insufficient documentation

## 2021-06-10 DIAGNOSIS — R002 Palpitations: Secondary | ICD-10-CM | POA: Insufficient documentation

## 2021-06-11 ENCOUNTER — Other Ambulatory Visit: Payer: Self-pay

## 2021-06-11 ENCOUNTER — Encounter: Payer: Self-pay | Admitting: Internal Medicine

## 2021-06-11 ENCOUNTER — Ambulatory Visit: Payer: 59 | Admitting: Internal Medicine

## 2021-06-11 DIAGNOSIS — R000 Tachycardia, unspecified: Secondary | ICD-10-CM | POA: Diagnosis not present

## 2021-06-11 DIAGNOSIS — R002 Palpitations: Secondary | ICD-10-CM

## 2021-06-11 MED ORDER — PROPRANOLOL HCL 20 MG PO TABS
20.0000 mg | ORAL_TABLET | ORAL | 0 refills | Status: AC | PRN
  Filled 2021-06-11: qty 30, 30d supply, fill #0

## 2021-06-11 NOTE — Progress Notes (Signed)
ELECTROPHYSIOLOGY OFFICE NOTE  Patient ID: Jill Andrews, MRN: [id number], DOB/AGE: 1981/07/24 40 y.o. Admit date: (Not on file) Date of Consult: 06/11/2021  Primary Physician: Jill Arbour, MD Primary Cardiologist:       Jill Andrews is a 40 y.o. female who is being seen today for the evaluation of palpitations     HPI Jill Andrews is a 40 y.o. female seen at her own request following the recurrence of palpitations associated with the use of medications taken for weight loss.  Seen in 2018 with 2 different palpitation syndromes, the first with skips consistent with PVCs that were identified on monitoring and the second a sensation of racing associated with exertion and showers.  In the past she has taken beta-blockers to good effect on an as-needed basis  At bedtime her diet was deplete of salt and water and with repletion her symptoms abated.  She has occasional "thud," and it was these that worsened with weight loss medications.  She is otherwise fit.  Is having no orthostatic, heat, shower, menses intolerance.    DATE TEST EF   10/16 Echo   normal %         Date Cr K TSH Hgb  5/22  1.0 4.4 1.154 13.4             Past Medical History:  Diagnosis Date   Anemia    on Iron supplement presently   Anxiety    has been treated in past for such, no meds currently   Cervicalgia    GERD (gastroesophageal reflux disease)    with or without pregnancy, on meds for such   Headache    migraines related to oral Birth Control Estrogen, d/c'ed   Irritable bowel syndrome (IBS)    when eatting well- "horrible cramps. diarrhea"   Neuromuscular disorder (HCC)    Sciatica    Lumbar 4-5, had steroid injections- relieved   SVT (supraventricular tachycardia) (HCC)       Surgical History:  Past Surgical History:  Procedure Laterality Date   COLONOSCOPY WITH PROPOFOL N/A 02/05/2016   Procedure: COLONOSCOPY WITH PROPOFOL;  Surgeon: Jill Maxwell, MD;   Location: [facility] ENDOSCOPY;  Service: Endoscopy;  Laterality: N/A;   ESOPHAGOGASTRODUODENOSCOPY (EGD) WITH PROPOFOL N/A 02/05/2016   Procedure: ESOPHAGOGASTRODUODENOSCOPY (EGD) WITH PROPOFOL;  Surgeon: Jill Maxwell, MD;  Location: [facility] ENDOSCOPY;  Service: Endoscopy;  Laterality: N/A;     Home Meds: Current Meds  Medication Sig   Digestive Enzyme CAPS Take 1 tablet by mouth daily.   diphenhydrAMINE-APAP, sleep, (DIPHENHYDRAMINE-APAP PO) Take 1 tablet by mouth daily.   MAGNESIUM BISGLYCINATE PO Take 1 tablet by mouth daily.   Multiple Vitamin (MULTI VITAMIN PO) Take 1 tablet by mouth daily.   Nutritional Supp - Diet Aids (CARB INTERCEPT/PHASE 2) CAPS Take 1 tablet by mouth daily.   Probiotic Product (PROBIOTIC DIGESTIVE SUPP) CAPS Take 1 capsule by mouth daily.   propranolol (INDERAL) 20 MG tablet Take 1 tablet (20 mg total) by mouth as needed (as needed for palpitations).   QUERCETIN PO Take 1 tablet by mouth daily.   venlafaxine XR (EFFEXOR-XR) 150 MG 24 hr capsule TAKE 1 CAPSULE BY MOUTH ONCE DAILY   Vitamin D, Cholecalciferol, 25 MCG (1000 UT) CAPS Take 1 tablet by mouth daily.    Allergies:  Allergies  Allergen Reactions   Sulfa Antibiotics Nausea And Vomiting   Penicillins Rash    Social History   Socioeconomic History  Marital status: Married    Spouse name: Not on file   Number of children: Not on file   Years of education: Not on file   Highest education level: Not on file  Occupational History   Not on file  Tobacco Use   Smoking status: Never   Smokeless tobacco: Never  Vaping Use   Vaping Use: Not on file  Substance and Sexual Activity   Alcohol use: No   Drug use: No   Sexual activity: Yes    Birth control/protection: None  Other Topics Concern   Not on file  Social History Narrative   Not on file   Social Determinants of Health   Financial Resource Strain: Not on file  Food Insecurity: Not on file  Transportation Needs: Not on file  Physical  Activity: Not on file  Stress: Not on file  Social Connections: Not on file  Intimate Partner Violence: Not on file     Family History  Problem Relation Age of Onset   Heart murmur Father    Hyperlipidemia Mother    Heart Problems Maternal Grandmother    Heart Problems Maternal Grandfather    Heart Problems Paternal Grandmother    Heart failure Paternal Grandmother    Heart Problems Paternal Grandfather      ROS:  Please see the history of present illness.     All other systems reviewed and negative.    Physical Exam: BP 120/71   Pulse 81   Ht 5' (1.524 m)   Wt 149 lb (67.6 kg)   BMI 29.10 kg/m    General: Well developed, well nourished female in no acute distress. Head: Normocephalic, atraumatic, sclera non-icteric, no xanthomas, nares are without discharge. EENT: normal  Lymph Nodes:  none Neck: Negative for carotid bruits. JVD not elevated. Back:without scoliosis kyphosis her vital signs and on the regular age also Lungs: Clear bilaterally to auscultation without wheezes, rales, or rhonchi. Breathing is unlabored. Heart: RRR with S1 S2. No murmur . No rubs, or gallops appreciated. Abdomen: Soft, non-tender, non-distended with normoactive bowel sounds. No hepatomegaly. No rebound/guarding. No obvious abdominal masses. Msk:  Strength and tone appear normal for age. Extremities: No clubbing or cyanosis. No dema.  Distal pedal pulses are 2+ and equal bilaterally. Skin: Warm and Dry Neuro: Alert and oriented X 3. CN III-XII intact Grossly normal sensory and motor function . Psych:  Responds to questions appropriately with a normal affect.      Labs: Cardiac Enzymes No results for input(s): CKTOTAL, CKMB, TROPONINI in the last 72 hours. CBC Lab Results  Component Value Date   WBC 15.2 (H) 03/05/2017   HGB 9.7 (L) 03/05/2017   HCT 29.2 (L) 03/05/2017   MCV 93.5 03/05/2017   PLT 194 03/05/2017   PROTIME: No results for input(s): LABPROT, INR in the last 72  hours. Chemistry No results for input(s): NA, K, CL, CO2, BUN, CREATININE, CALCIUM, PROT, BILITOT, ALKPHOS, ALT, AST, GLUCOSE in the last 168 hours.  Invalid input(s): LABALBU Lipids No results found for: CHOL, HDL, LDLCALC, TRIG BNP No results found for: PROBNP Thyroid Function Tests: No results for input(s): TSH, T4TOTAL, T3FREE, THYROIDAB in the last 72 hours.  Invalid input(s): FREET3 Miscellaneous No results found for: DDIMER  Radiology/Studies:  No results found.  EKG: Sinus at 82 15/09/35   Assessment and Plan:  Dysautonomia   PVCs  Obesity  Patient's dysautonomic symptoms are relatively quiescient.  She took a stimulant for weight loss that aggravated her palpitations  probably PVCs.  She has since stopped and she is back to the usual infrequent "thud "these have been stress aggravated; will give a prescription for Inderal 20 mg to take as needed  Discussed weight loss strategies including carb frequent, with exercise    Sherryl Manges

## 2021-06-11 NOTE — Patient Instructions (Signed)
Medication Instructions:  Your physician has recommended you make the following change in your medication:   ** Begin Propranolol 20mg  - 1 tablet as needed for palpitations.  *If you need a refill on your cardiac medications before your next appointment, please call your pharmacy*   Lab Work: None ordered.  If you have labs (blood work) drawn today and your tests are completely normal, you will receive your results only by: MyChart Message (if you have MyChart) OR A paper copy in the mail If you have any lab test that is abnormal or we need to change your treatment, we will call you to review the results.   Testing/Procedures: None ordered.    Follow-Up: At [facility], you and your health needs are our priority.  As part of our continuing mission to provide you with exceptional heart care, we have created designated Provider Care Teams.  These Care Teams include your primary Cardiologist (physician) and Advanced Practice Providers (APPs -  Physician Assistants and Nurse Practitioners) who all work together to provide you with the care you need, when you need it.  We recommend signing up for the patient portal called "MyChart".  Sign up information is provided on this After Visit Summary.  MyChart is used to connect with patients for Virtual Visits (Telemedicine).  Patients are able to view lab/test results, encounter notes, upcoming appointments, etc.  Non-urgent messages can be sent to your provider as well.   To learn more about what you can do with MyChart, go to [facility].    Your next appointment:   Follow up with Dr [email or web address] as needed

## 2021-07-03 MED FILL — Venlafaxine HCl Cap ER 24HR 150 MG (Base Equivalent): ORAL | 30 days supply | Qty: 30 | Fill #3 | Status: AC

## 2021-07-04 ENCOUNTER — Other Ambulatory Visit: Payer: Self-pay

## 2021-08-02 MED FILL — Venlafaxine HCl Cap ER 24HR 150 MG (Base Equivalent): ORAL | 30 days supply | Qty: 30 | Fill #4 | Status: AC

## 2021-08-03 ENCOUNTER — Other Ambulatory Visit: Payer: Self-pay

## 2021-09-03 ENCOUNTER — Other Ambulatory Visit: Payer: Self-pay

## 2021-09-03 MED ORDER — AZITHROMYCIN 250 MG PO TABS
ORAL_TABLET | ORAL | 0 refills | Status: DC
  Filled 2021-09-03: qty 6, 5d supply, fill #0

## 2021-09-03 MED ORDER — PSEUDOEPHEDRINE-GUAIFENESIN ER 60-600 MG PO TB12: ORAL_TABLET | ORAL | 0 refills | Status: DC

## 2021-09-03 MED ORDER — PREDNISONE 10 MG PO TABS
ORAL_TABLET | ORAL | 0 refills | Status: DC
  Filled 2021-09-03: qty 21, 6d supply, fill #0

## 2021-09-03 MED FILL — Venlafaxine HCl Cap ER 24HR 150 MG (Base Equivalent): ORAL | 30 days supply | Qty: 30 | Fill #5 | Status: AC

## 2021-09-11 ENCOUNTER — Other Ambulatory Visit: Payer: Self-pay

## 2021-10-03 MED FILL — Venlafaxine HCl Cap ER 24HR 150 MG (Base Equivalent): ORAL | 30 days supply | Qty: 30 | Fill #6 | Status: AC

## 2021-10-04 ENCOUNTER — Other Ambulatory Visit: Payer: Self-pay

## 2021-11-01 ENCOUNTER — Other Ambulatory Visit: Payer: Self-pay

## 2021-11-01 MED ORDER — VENLAFAXINE HCL ER 150 MG PO CP24
ORAL_CAPSULE | ORAL | 1 refills | Status: DC
  Filled 2021-11-01: qty 90, 90d supply, fill #0

## 2021-11-01 MED ORDER — VENLAFAXINE HCL ER 150 MG PO CP24
150.0000 mg | ORAL_CAPSULE | Freq: Every day | ORAL | 11 refills | Status: DC
  Filled 2021-11-01: qty 30, 30d supply, fill #0
  Filled 2021-12-02: qty 30, 30d supply, fill #1
  Filled 2022-01-01: qty 30, 30d supply, fill #2
  Filled 2022-01-31: qty 30, 30d supply, fill #3
  Filled 2022-03-03: qty 30, 30d supply, fill #4
  Filled 2022-04-02: qty 30, 30d supply, fill #5
  Filled 2022-05-02: qty 30, 30d supply, fill #6
  Filled 2022-05-30: qty 30, 30d supply, fill #7
  Filled 2022-07-01: qty 30, 30d supply, fill #8
  Filled 2022-07-31: qty 30, 30d supply, fill #9
  Filled 2022-08-29: qty 30, 30d supply, fill #10
  Filled 2022-09-30: qty 30, 30d supply, fill #11

## 2021-12-03 ENCOUNTER — Other Ambulatory Visit: Payer: Self-pay

## 2021-12-11 ENCOUNTER — Other Ambulatory Visit: Payer: Self-pay

## 2021-12-11 MED ORDER — PREDNISONE 10 MG PO TABS
ORAL_TABLET | ORAL | 0 refills | Status: DC
  Filled 2021-12-11: qty 21, 6d supply, fill #0

## 2021-12-11 MED ORDER — AZITHROMYCIN 250 MG PO TABS
ORAL_TABLET | ORAL | 0 refills | Status: DC
  Filled 2021-12-11: qty 6, 5d supply, fill #0

## 2022-01-02 ENCOUNTER — Other Ambulatory Visit: Payer: Self-pay

## 2022-01-31 ENCOUNTER — Other Ambulatory Visit: Payer: Self-pay

## 2022-02-06 ENCOUNTER — Other Ambulatory Visit: Payer: Self-pay

## 2022-03-04 ENCOUNTER — Other Ambulatory Visit: Payer: Self-pay

## 2022-04-03 ENCOUNTER — Other Ambulatory Visit: Payer: Self-pay

## 2022-05-03 ENCOUNTER — Other Ambulatory Visit: Payer: Self-pay

## 2022-05-06 ENCOUNTER — Other Ambulatory Visit: Payer: Self-pay

## 2022-05-06 MED ORDER — CIPROFLOXACIN HCL 500 MG PO TABS
500.0000 mg | ORAL_TABLET | Freq: Two times a day (BID) | ORAL | 0 refills | Status: DC
  Filled 2022-05-06: qty 14, 7d supply, fill #0

## 2022-05-30 ENCOUNTER — Other Ambulatory Visit: Payer: Self-pay

## 2022-06-03 ENCOUNTER — Other Ambulatory Visit: Payer: Self-pay

## 2022-06-03 DIAGNOSIS — J019 Acute sinusitis, unspecified: Secondary | ICD-10-CM | POA: Diagnosis not present

## 2022-06-03 DIAGNOSIS — J029 Acute pharyngitis, unspecified: Secondary | ICD-10-CM | POA: Diagnosis not present

## 2022-06-03 MED ORDER — AZITHROMYCIN 250 MG PO TABS
ORAL_TABLET | ORAL | 0 refills | Status: DC
  Filled 2022-06-03: qty 6, 5d supply, fill #0

## 2022-06-05 ENCOUNTER — Other Ambulatory Visit: Payer: Self-pay

## 2022-06-05 DIAGNOSIS — R600 Localized edema: Secondary | ICD-10-CM | POA: Diagnosis not present

## 2022-06-05 MED ORDER — PREDNISONE 10 MG PO TABS
ORAL_TABLET | ORAL | 0 refills | Status: DC
  Filled 2022-06-05: qty 21, 6d supply, fill #0

## 2022-07-02 ENCOUNTER — Other Ambulatory Visit: Payer: Self-pay

## 2022-07-31 ENCOUNTER — Other Ambulatory Visit: Payer: Self-pay

## 2022-08-29 ENCOUNTER — Other Ambulatory Visit: Payer: Self-pay

## 2022-10-01 ENCOUNTER — Other Ambulatory Visit: Payer: Self-pay

## 2022-10-28 ENCOUNTER — Other Ambulatory Visit: Payer: Self-pay

## 2022-10-28 MED ORDER — OSELTAMIVIR PHOSPHATE 75 MG PO CAPS
75.0000 mg | ORAL_CAPSULE | Freq: Two times a day (BID) | ORAL | 1 refills | Status: DC
  Filled 2022-10-28: qty 10, 5d supply, fill #0

## 2022-10-29 ENCOUNTER — Other Ambulatory Visit: Payer: Self-pay

## 2022-10-29 MED ORDER — VENLAFAXINE HCL ER 150 MG PO CP24
150.0000 mg | ORAL_CAPSULE | Freq: Every day | ORAL | 0 refills | Status: DC
  Filled 2022-10-29: qty 90, 90d supply, fill #0

## 2022-10-30 ENCOUNTER — Other Ambulatory Visit: Payer: Self-pay

## 2023-01-28 ENCOUNTER — Other Ambulatory Visit: Payer: Self-pay

## 2023-01-28 DIAGNOSIS — E7800 Pure hypercholesterolemia, unspecified: Secondary | ICD-10-CM | POA: Diagnosis not present

## 2023-01-28 DIAGNOSIS — Z79899 Other long term (current) drug therapy: Secondary | ICD-10-CM | POA: Diagnosis not present

## 2023-01-28 DIAGNOSIS — I471 Supraventricular tachycardia, unspecified: Secondary | ICD-10-CM | POA: Diagnosis not present

## 2023-01-28 DIAGNOSIS — F419 Anxiety disorder, unspecified: Secondary | ICD-10-CM | POA: Diagnosis not present

## 2023-01-28 DIAGNOSIS — F32A Depression, unspecified: Secondary | ICD-10-CM | POA: Diagnosis not present

## 2023-01-28 MED ORDER — DESVENLAFAXINE SUCCINATE ER 100 MG PO TB24
100.0000 mg | ORAL_TABLET | Freq: Every day | ORAL | 11 refills | Status: DC
  Filled 2023-01-28: qty 30, 30d supply, fill #0
  Filled 2023-02-26: qty 30, 30d supply, fill #1
  Filled 2023-03-27: qty 30, 30d supply, fill #2
  Filled 2023-04-25: qty 30, 30d supply, fill #3
  Filled 2023-05-28: qty 30, 30d supply, fill #4
  Filled 2023-06-24: qty 30, 30d supply, fill #5
  Filled 2023-07-24: qty 30, 30d supply, fill #6
  Filled 2023-08-25: qty 30, 30d supply, fill #7
  Filled 2023-09-22: qty 30, 30d supply, fill #8
  Filled 2023-10-21: qty 30, 30d supply, fill #9
  Filled 2023-11-20: qty 30, 30d supply, fill #10
  Filled 2023-12-24: qty 30, 30d supply, fill #11

## 2023-01-28 MED ORDER — ALPRAZOLAM 0.25 MG PO TABS
0.2500 mg | ORAL_TABLET | Freq: Two times a day (BID) | ORAL | 2 refills | Status: DC
  Filled 2023-01-28: qty 30, 30d supply, fill #0
  Filled 2023-03-25: qty 30, 30d supply, fill #1
  Filled 2023-04-25: qty 30, 30d supply, fill #2

## 2023-01-28 MED ORDER — LAMOTRIGINE 100 MG PO TABS
50.0000 mg | ORAL_TABLET | Freq: Two times a day (BID) | ORAL | 11 refills | Status: AC
  Filled 2023-01-28: qty 30, 30d supply, fill #0

## 2023-02-18 ENCOUNTER — Other Ambulatory Visit: Payer: Self-pay

## 2023-02-18 MED ORDER — CIPROFLOXACIN HCL 500 MG PO TABS
500.0000 mg | ORAL_TABLET | Freq: Two times a day (BID) | ORAL | 0 refills | Status: DC
  Filled 2023-02-18: qty 14, 7d supply, fill #0

## 2023-03-25 ENCOUNTER — Other Ambulatory Visit: Payer: Self-pay

## 2023-04-25 ENCOUNTER — Other Ambulatory Visit: Payer: Self-pay

## 2023-05-06 DIAGNOSIS — Z01419 Encounter for gynecological examination (general) (routine) without abnormal findings: Secondary | ICD-10-CM | POA: Diagnosis not present

## 2023-06-24 ENCOUNTER — Other Ambulatory Visit: Payer: Self-pay

## 2023-06-24 MED ORDER — ALPRAZOLAM 0.25 MG PO TABS
0.2500 mg | ORAL_TABLET | Freq: Two times a day (BID) | ORAL | 2 refills | Status: AC | PRN
  Filled 2023-06-24: qty 30, 15d supply, fill #0
  Filled 2023-10-21: qty 30, 15d supply, fill #1

## 2023-06-25 ENCOUNTER — Other Ambulatory Visit: Payer: Self-pay

## 2023-09-03 DIAGNOSIS — H5213 Myopia, bilateral: Secondary | ICD-10-CM | POA: Diagnosis not present

## 2023-10-21 ENCOUNTER — Other Ambulatory Visit: Payer: Self-pay

## 2023-12-24 ENCOUNTER — Other Ambulatory Visit: Payer: Self-pay

## 2024-01-19 ENCOUNTER — Other Ambulatory Visit: Payer: Self-pay

## 2024-01-19 MED ORDER — DESVENLAFAXINE SUCCINATE ER 100 MG PO TB24
100.0000 mg | ORAL_TABLET | Freq: Every day | ORAL | 0 refills | Status: DC
  Filled 2024-01-19: qty 30, 30d supply, fill #0

## 2024-02-24 ENCOUNTER — Other Ambulatory Visit: Payer: Self-pay

## 2024-02-25 ENCOUNTER — Other Ambulatory Visit: Payer: Self-pay

## 2024-02-25 MED ORDER — DESVENLAFAXINE SUCCINATE ER 100 MG PO TB24
100.0000 mg | ORAL_TABLET | Freq: Every day | ORAL | 0 refills | Status: DC
  Filled 2024-02-25: qty 30, 30d supply, fill #0

## 2024-03-22 ENCOUNTER — Other Ambulatory Visit: Payer: Self-pay

## 2024-03-22 MED ORDER — DESVENLAFAXINE SUCCINATE ER 100 MG PO TB24
100.0000 mg | ORAL_TABLET | Freq: Every day | ORAL | 0 refills | Status: DC
  Filled 2024-03-22: qty 30, 30d supply, fill #0

## 2024-04-23 ENCOUNTER — Other Ambulatory Visit: Payer: Self-pay

## 2024-04-23 MED ORDER — DESVENLAFAXINE SUCCINATE ER 100 MG PO TB24
100.0000 mg | ORAL_TABLET | Freq: Every day | ORAL | 5 refills | Status: DC
  Filled 2024-04-23: qty 30, 30d supply, fill #0

## 2024-05-05 ENCOUNTER — Other Ambulatory Visit: Payer: Self-pay

## 2024-05-05 DIAGNOSIS — F32A Depression, unspecified: Secondary | ICD-10-CM | POA: Diagnosis not present

## 2024-05-05 DIAGNOSIS — Z0000 Encounter for general adult medical examination without abnormal findings: Secondary | ICD-10-CM | POA: Diagnosis not present

## 2024-05-05 DIAGNOSIS — Z1231 Encounter for screening mammogram for malignant neoplasm of breast: Secondary | ICD-10-CM | POA: Diagnosis not present

## 2024-05-05 DIAGNOSIS — R002 Palpitations: Secondary | ICD-10-CM | POA: Diagnosis not present

## 2024-05-05 DIAGNOSIS — Z79899 Other long term (current) drug therapy: Secondary | ICD-10-CM | POA: Diagnosis not present

## 2024-05-05 DIAGNOSIS — R635 Abnormal weight gain: Secondary | ICD-10-CM | POA: Diagnosis not present

## 2024-05-05 DIAGNOSIS — F419 Anxiety disorder, unspecified: Secondary | ICD-10-CM | POA: Diagnosis not present

## 2024-05-05 MED ORDER — DESVENLAFAXINE SUCCINATE ER 100 MG PO TB24
100.0000 mg | ORAL_TABLET | Freq: Every day | ORAL | 3 refills | Status: AC
  Filled 2024-05-05 – 2024-05-24 (×2): qty 90, 90d supply, fill #0
  Filled 2024-11-18: qty 90, 90d supply, fill #1

## 2024-05-05 MED ORDER — ALPRAZOLAM 0.25 MG PO TABS
0.2500 mg | ORAL_TABLET | Freq: Two times a day (BID) | ORAL | 2 refills | Status: AC | PRN
  Filled 2024-05-05 – 2024-05-24 (×2): qty 30, 15d supply, fill #0

## 2024-05-06 ENCOUNTER — Other Ambulatory Visit: Payer: Self-pay | Admitting: Internal Medicine

## 2024-05-06 DIAGNOSIS — Z1231 Encounter for screening mammogram for malignant neoplasm of breast: Secondary | ICD-10-CM

## 2024-05-16 ENCOUNTER — Encounter: Payer: Self-pay | Admitting: Family

## 2024-05-16 ENCOUNTER — Telehealth: Admitting: Family

## 2024-05-16 DIAGNOSIS — R21 Rash and other nonspecific skin eruption: Secondary | ICD-10-CM | POA: Diagnosis not present

## 2024-05-16 MED ORDER — TRIAMCINOLONE ACETONIDE 0.1 % EX CREA
1.0000 | TOPICAL_CREAM | Freq: Two times a day (BID) | CUTANEOUS | 0 refills | Status: AC
  Filled 2024-05-16: qty 30, 15d supply, fill #0

## 2024-05-16 MED ORDER — DOXYCYCLINE HYCLATE 100 MG PO TABS
100.0000 mg | ORAL_TABLET | Freq: Two times a day (BID) | ORAL | 0 refills | Status: AC
  Filled 2024-05-16: qty 20, 10d supply, fill #0

## 2024-05-16 NOTE — Progress Notes (Signed)
 Virtual Visit Consent   Jill Andrews, you are scheduled for a virtual visit with a [facility] provider today. Just as with appointments in the office, your consent must be obtained to participate. Your consent will be active for this visit and any virtual visit you may have with one of our providers in the next 365 days. If you have a MyChart account, a copy of this consent can be sent to you electronically.  As this is a virtual visit, video technology does not allow for your provider to perform a traditional examination. This may limit your provider's ability to fully assess your condition. If your provider identifies any concerns that need to be evaluated in person or the need to arrange testing (such as labs, EKG, etc.), we will make arrangements to do so. Although advances in technology are sophisticated, we cannot ensure that it will always work on either your end or our end. If the connection with a video visit is poor, the visit may have to be switched to a telephone visit. With either a video or telephone visit, we are not always able to ensure that we have a secure connection.  By engaging in this virtual visit, you consent to the provision of healthcare and authorize for your insurance to be billed (if applicable) for the services provided during this visit. Depending on your insurance coverage, you may receive a charge related to this service.  I need to obtain your verbal consent now. Are you willing to proceed with your visit today? Jill Andrews has provided verbal consent on 05/16/2024 for a virtual visit (video or telephone). Tommas Fragmin, FNP  Date: 05/16/2024 7:02 PM   Virtual Visit via Video Note   I, Tommas Fragmin, connected with  Jill Andrews  ([id number], [birth year]) on 05/16/24 at  7:00 PM EDT by a video-enabled telemedicine application and verified that I am speaking with the correct person using two identifiers.  Location: Patient: Virtual Visit Location Patient:  Home Provider: Virtual Visit Location Provider: Home Office   I discussed the limitations of evaluation and management by telemedicine and the availability of in person appointments. The patient expressed understanding and agreed to proceed.    History of Present Illness: Jill Andrews is a 43 y.o. who identifies as a female who was assigned female at birth, and is being seen today for bullseye rash on right thigh that she noticed 2-3 days. Denies any known tick bite or bite. Denies any fever, body aches, headaches, or joint pain. Reports mild tenderness when she touches and mild itching.   HPI: HPI  Problems:  Patient Active Problem List   Diagnosis Date Noted   Tachycardia 06/10/2021   Palpitations 06/10/2021   Pregnancy 03/03/2017    Allergies:  Allergies  Allergen Reactions   Sulfa Antibiotics Nausea And Vomiting   Penicillins Rash   Medications:  Current Outpatient Medications:    doxycycline (VIBRA-TABS) 100 MG tablet, Take 1 tablet (100 mg total) by mouth 2 (two) times daily., Disp: 20 tablet, Rfl: 0   triamcinolone cream (KENALOG) 0.1 %, Apply 1 Application topically 2 (two) times daily., Disp: 30 g, Rfl: 0   ALPRAZolam  (XANAX ) 0.25 MG tablet, Take 1 tablet (0.25 mg total) by mouth 2 (two) times daily as needed for sleep., Disp: 30 tablet, Rfl: 2   ALPRAZolam  (XANAX ) 0.25 MG tablet, Take 1 tablet (0.25 mg total) by mouth 2 (two) times daily as needed for sleep and anxiety., Disp: 30 tablet, Rfl: 2  desvenlafaxine  (PRISTIQ ) 100 MG 24 hr tablet, Take 1 tablet (100 mg total) by mouth daily., Disp: 90 tablet, Rfl: 3   Digestive Enzyme CAPS, Take 1 tablet by mouth daily., Disp: , Rfl:    diphenhydrAMINE -APAP, sleep, (DIPHENHYDRAMINE -APAP PO), Take 1 tablet by mouth daily., Disp: , Rfl:    lamoTRIgine  (LAMICTAL ) 100 MG tablet, Take 1/2 tablet (50 mg total) by mouth every 12 (twelve) hours., Disp: 30 tablet, Rfl: 11   MAGNESIUM  BISGLYCINATE PO, Take 1 tablet by mouth daily., Disp:  , Rfl:    Multiple Vitamin (MULTI VITAMIN PO), Take 1 tablet by mouth daily., Disp: , Rfl:    Nutritional Supp - Diet Aids (CARB INTERCEPT/PHASE 2) CAPS, Take 1 tablet by mouth daily., Disp: , Rfl:    Probiotic Product (PROBIOTIC DIGESTIVE SUPP) CAPS, Take 1 capsule by mouth daily., Disp: , Rfl:    propranolol  (INDERAL ) 20 MG tablet, Take 1 tablet (20 mg total) by mouth as needed (as needed for palpitations)., Disp: 30 tablet, Rfl: 0   Vitamin D, Cholecalciferol, 25 MCG (1000 UT) CAPS, Take 1 tablet by mouth daily., Disp: , Rfl:   Observations/Objective: Patient is well-developed, well-nourished in no acute distress.  Resting comfortably  at home.  Head is normocephalic, atraumatic.  No labored breathing.  Speech is clear and coherent with logical content.  Patient is alert and oriented at baseline.  Approx 2.5 in circular bullseye rash, erythemas   Assessment and Plan: 1. Rash (Primary) - doxycycline (VIBRA-TABS) 100 MG tablet; Take 1 tablet (100 mg total) by mouth 2 (two) times daily.  Dispense: 20 tablet; Refill: 0 - triamcinolone cream (KENALOG) 0.1 %; Apply 1 Application topically 2 (two) times daily.  Dispense: 30 g; Refill: 0  Will give doxycyline for tick/cellulitis Kenalog cream as needed Cool compresses Follow up if symptoms worsen or do not improve   Follow Up Instructions: I discussed the assessment and treatment plan with the patient. The patient was provided an opportunity to ask questions and all were answered. The patient agreed with the plan and demonstrated an understanding of the instructions.  A copy of instructions were sent to the patient via MyChart unless otherwise noted below.     The patient was advised to call back or seek an in-person evaluation if the symptoms worsen or if the condition fails to improve as anticipated.    Tommas Fragmin, FNP

## 2024-05-17 ENCOUNTER — Other Ambulatory Visit: Payer: Self-pay

## 2024-05-18 DIAGNOSIS — R509 Fever, unspecified: Secondary | ICD-10-CM | POA: Diagnosis not present

## 2024-05-18 DIAGNOSIS — L03115 Cellulitis of right lower limb: Secondary | ICD-10-CM | POA: Diagnosis not present

## 2024-05-24 ENCOUNTER — Other Ambulatory Visit: Payer: Self-pay

## 2024-08-19 ENCOUNTER — Other Ambulatory Visit: Payer: Self-pay

## 2024-08-19 MED ORDER — ALPRAZOLAM 0.25 MG PO TABS
0.2500 mg | ORAL_TABLET | Freq: Two times a day (BID) | ORAL | 2 refills | Status: AC | PRN
  Filled 2024-11-18: qty 30, 15d supply, fill #0

## 2024-08-19 MED ORDER — DESVENLAFAXINE SUCCINATE ER 100 MG PO TB24
100.0000 mg | ORAL_TABLET | Freq: Every day | ORAL | 3 refills | Status: AC
  Filled 2024-08-19: qty 90, 90d supply, fill #0

## 2024-09-16 ENCOUNTER — Telehealth: Admitting: Physician Assistant

## 2024-09-16 ENCOUNTER — Other Ambulatory Visit: Payer: Self-pay

## 2024-09-16 DIAGNOSIS — R3989 Other symptoms and signs involving the genitourinary system: Secondary | ICD-10-CM

## 2024-09-16 MED ORDER — NITROFURANTOIN MONOHYD MACRO 100 MG PO CAPS
100.0000 mg | ORAL_CAPSULE | Freq: Two times a day (BID) | ORAL | 0 refills | Status: AC
  Filled 2024-09-16: qty 10, 5d supply, fill #0

## 2024-09-16 NOTE — Progress Notes (Signed)

## 2024-09-22 ENCOUNTER — Other Ambulatory Visit: Payer: Self-pay

## 2024-09-22 MED ORDER — ZEPBOUND 2.5 MG/0.5ML ~~LOC~~ SOAJ
2.5000 mg | SUBCUTANEOUS | 2 refills | Status: AC
  Filled 2024-09-22: qty 2, 28d supply, fill #0

## 2024-09-27 ENCOUNTER — Other Ambulatory Visit: Payer: Self-pay

## 2024-11-18 ENCOUNTER — Other Ambulatory Visit: Payer: Self-pay
